# Patient Record
Sex: Female | Born: 1955 | Race: White | Hispanic: No | Marital: Married | Smoking: Never smoker
Health system: Southern US, Community
[De-identification: ages and names within clinical notes are randomized; demographics above are authoritative.]

## PROBLEM LIST (undated history)

## (undated) DIAGNOSIS — T7840XA Allergy, unspecified, initial encounter: Secondary | ICD-10-CM

## (undated) DIAGNOSIS — N186 End stage renal disease: Secondary | ICD-10-CM

## (undated) DIAGNOSIS — D649 Anemia, unspecified: Secondary | ICD-10-CM

## (undated) DIAGNOSIS — Z5189 Encounter for other specified aftercare: Secondary | ICD-10-CM

## (undated) DIAGNOSIS — E039 Hypothyroidism, unspecified: Secondary | ICD-10-CM

## (undated) DIAGNOSIS — Z8719 Personal history of other diseases of the digestive system: Secondary | ICD-10-CM

## (undated) DIAGNOSIS — Z9889 Other specified postprocedural states: Secondary | ICD-10-CM

## (undated) DIAGNOSIS — M8580 Other specified disorders of bone density and structure, unspecified site: Secondary | ICD-10-CM

## (undated) DIAGNOSIS — K641 Second degree hemorrhoids: Principal | ICD-10-CM

## (undated) DIAGNOSIS — I10 Essential (primary) hypertension: Secondary | ICD-10-CM

## (undated) DIAGNOSIS — E785 Hyperlipidemia, unspecified: Secondary | ICD-10-CM

## (undated) DIAGNOSIS — K219 Gastro-esophageal reflux disease without esophagitis: Secondary | ICD-10-CM

## (undated) HISTORY — DX: Anemia, unspecified: D64.9

## (undated) HISTORY — DX: Gastro-esophageal reflux disease without esophagitis: K21.9

## (undated) HISTORY — PX: COLONOSCOPY: SHX174

## (undated) HISTORY — DX: Allergy, unspecified, initial encounter: T78.40XA

## (undated) HISTORY — DX: Second degree hemorrhoids: K64.1

## (undated) HISTORY — PX: HEMORRHOID BANDING: SHX5850

## (undated) HISTORY — DX: Encounter for other specified aftercare: Z51.89

## (undated) HISTORY — PX: POLYPECTOMY: SHX149

## (undated) HISTORY — DX: Essential (primary) hypertension: I10

## (undated) HISTORY — DX: Personal history of other diseases of the digestive system: Z87.19

## (undated) HISTORY — DX: End stage renal disease: N18.6

## (undated) HISTORY — DX: Other specified disorders of bone density and structure, unspecified site: M85.80

## (undated) HISTORY — PX: UPPER GASTROINTESTINAL ENDOSCOPY: SHX188

## (undated) HISTORY — PX: OTHER SURGICAL HISTORY: SHX169

## (undated) HISTORY — DX: Hyperlipidemia, unspecified: E78.5

## (undated) HISTORY — DX: Other specified postprocedural states: Z98.890

## (undated) HISTORY — DX: Hypothyroidism, unspecified: E03.9

---

## 1981-06-12 HISTORY — PX: EXPLORATORY LAPAROTOMY: SUR591

## 1999-04-29 ENCOUNTER — Ambulatory Visit (HOSPITAL_COMMUNITY): Admission: RE | Admit: 1999-04-29 | Discharge: 1999-04-29 | Payer: Self-pay | Admitting: Gastroenterology

## 1999-04-29 ENCOUNTER — Encounter: Payer: Self-pay | Admitting: Gastroenterology

## 2003-01-28 ENCOUNTER — Other Ambulatory Visit: Admission: RE | Admit: 2003-01-28 | Discharge: 2003-01-28 | Payer: Self-pay | Admitting: Obstetrics & Gynecology

## 2004-02-16 ENCOUNTER — Other Ambulatory Visit: Admission: RE | Admit: 2004-02-16 | Discharge: 2004-02-16 | Payer: Self-pay | Admitting: Obstetrics & Gynecology

## 2004-02-22 ENCOUNTER — Encounter: Admission: RE | Admit: 2004-02-22 | Discharge: 2004-02-22 | Payer: Self-pay | Admitting: Obstetrics & Gynecology

## 2005-02-28 ENCOUNTER — Encounter: Admission: RE | Admit: 2005-02-28 | Discharge: 2005-02-28 | Payer: Self-pay | Admitting: Obstetrics & Gynecology

## 2005-04-10 ENCOUNTER — Other Ambulatory Visit: Admission: RE | Admit: 2005-04-10 | Discharge: 2005-04-10 | Payer: Self-pay | Admitting: Obstetrics & Gynecology

## 2005-05-02 ENCOUNTER — Encounter: Admission: RE | Admit: 2005-05-02 | Discharge: 2005-05-02 | Payer: Self-pay | Admitting: Endocrinology

## 2005-05-23 ENCOUNTER — Ambulatory Visit: Payer: Self-pay | Admitting: Hematology & Oncology

## 2005-06-14 ENCOUNTER — Ambulatory Visit: Payer: Self-pay | Admitting: Gastroenterology

## 2005-06-29 ENCOUNTER — Encounter (INDEPENDENT_AMBULATORY_CARE_PROVIDER_SITE_OTHER): Payer: Self-pay | Admitting: *Deleted

## 2005-06-29 ENCOUNTER — Ambulatory Visit (HOSPITAL_COMMUNITY): Admission: RE | Admit: 2005-06-29 | Discharge: 2005-06-29 | Payer: Self-pay | Admitting: Nephrology

## 2005-07-10 ENCOUNTER — Ambulatory Visit: Payer: Self-pay | Admitting: Hematology & Oncology

## 2005-08-01 ENCOUNTER — Ambulatory Visit: Payer: Self-pay | Admitting: Gastroenterology

## 2005-08-01 ENCOUNTER — Encounter (INDEPENDENT_AMBULATORY_CARE_PROVIDER_SITE_OTHER): Payer: Self-pay | Admitting: Specialist

## 2005-09-01 ENCOUNTER — Ambulatory Visit: Payer: Self-pay | Admitting: Hematology & Oncology

## 2005-10-26 ENCOUNTER — Ambulatory Visit: Payer: Self-pay | Admitting: Hematology & Oncology

## 2005-10-30 LAB — CBC & DIFF AND RETIC
Basophils Absolute: 0 10*3/uL (ref 0.0–0.1)
EOS%: 7.7 % — ABNORMAL HIGH (ref 0.0–7.0)
HCT: 31.9 % — ABNORMAL LOW (ref 34.8–46.6)
HGB: 10.9 g/dL — ABNORMAL LOW (ref 11.6–15.9)
LYMPH%: 21.4 % (ref 14.0–48.0)
MCH: 31.7 pg (ref 26.0–34.0)
MCV: 92.7 fL (ref 81.0–101.0)
MONO%: 5.9 % (ref 0.0–13.0)
NEUT%: 64.5 % (ref 39.6–76.8)
Platelets: 274 10*3/uL (ref 145–400)

## 2005-11-01 LAB — TRANSFERRIN RECEPTOR, SOLUABLE: Transferrin Receptor, Soluble: 3.3 mg/L (ref 1.9–4.4)

## 2005-12-11 ENCOUNTER — Ambulatory Visit: Payer: Self-pay | Admitting: Hematology & Oncology

## 2005-12-11 LAB — COMPREHENSIVE METABOLIC PANEL
AST: 16 U/L (ref 0–37)
Albumin: 4.4 g/dL (ref 3.5–5.2)
Alkaline Phosphatase: 103 U/L (ref 39–117)
BUN: 42 mg/dL — ABNORMAL HIGH (ref 6–23)
Creatinine, Ser: 2.93 mg/dL — ABNORMAL HIGH (ref 0.40–1.20)
Potassium: 4.4 mEq/L (ref 3.5–5.3)

## 2005-12-11 LAB — CBC WITH DIFFERENTIAL/PLATELET
BASO%: 0.5 % (ref 0.0–2.0)
EOS%: 6.5 % (ref 0.0–7.0)
Eosinophils Absolute: 0.5 10*3/uL (ref 0.0–0.5)
MCV: 93.8 fL (ref 81.0–101.0)
MONO%: 6.2 % (ref 0.0–13.0)
NEUT#: 5.7 10*3/uL (ref 1.5–6.5)
RBC: 4.29 10*6/uL (ref 3.70–5.32)
RDW: 15.4 % — ABNORMAL HIGH (ref 11.3–14.5)

## 2005-12-14 LAB — FERRITIN: Ferritin: 334 ng/mL — ABNORMAL HIGH (ref 10–291)

## 2006-03-09 ENCOUNTER — Ambulatory Visit: Payer: Self-pay | Admitting: Hematology & Oncology

## 2006-03-12 LAB — CBC & DIFF AND RETIC
BASO%: 0.4 % (ref 0.0–2.0)
LYMPH%: 20.5 % (ref 14.0–48.0)
MCH: 30.8 pg (ref 26.0–34.0)
MCHC: 33.6 g/dL (ref 32.0–36.0)
MCV: 91.8 fL (ref 81.0–101.0)
MONO%: 6.1 % (ref 0.0–13.0)
Platelets: 271 10*3/uL (ref 145–400)
RBC: 3.72 10*6/uL (ref 3.70–5.32)
Retic %: 3.5 % — ABNORMAL HIGH (ref 0.4–2.3)

## 2006-03-12 LAB — COMPREHENSIVE METABOLIC PANEL
CO2: 23 mEq/L (ref 19–32)
Glucose, Bld: 124 mg/dL — ABNORMAL HIGH (ref 70–99)
Sodium: 139 mEq/L (ref 135–145)
Total Bilirubin: 0.3 mg/dL (ref 0.3–1.2)
Total Protein: 7.3 g/dL (ref 6.0–8.3)

## 2006-03-12 LAB — URIC ACID: Uric Acid, Serum: 7.4 mg/dL — ABNORMAL HIGH (ref 2.4–7.0)

## 2006-03-12 LAB — PHOSPHORUS: Phosphorus: 3 mg/dL (ref 2.3–4.6)

## 2006-03-30 ENCOUNTER — Encounter: Admission: RE | Admit: 2006-03-30 | Discharge: 2006-03-30 | Payer: Self-pay | Admitting: Obstetrics & Gynecology

## 2006-04-17 ENCOUNTER — Encounter: Admission: RE | Admit: 2006-04-17 | Discharge: 2006-04-17 | Payer: Self-pay | Admitting: Obstetrics & Gynecology

## 2006-06-06 ENCOUNTER — Ambulatory Visit: Payer: Self-pay | Admitting: Cardiology

## 2006-06-13 ENCOUNTER — Ambulatory Visit: Payer: Self-pay | Admitting: Hematology & Oncology

## 2006-06-18 LAB — CBC & DIFF AND RETIC
BASO%: 0.3 % (ref 0.0–2.0)
Eosinophils Absolute: 0.6 10*3/uL — ABNORMAL HIGH (ref 0.0–0.5)
HCT: 37.8 % (ref 34.8–46.6)
HGB: 12.2 g/dL (ref 11.6–15.9)
IRF: 0.42 — ABNORMAL HIGH (ref 0.130–0.330)
LYMPH%: 21.4 % (ref 14.0–48.0)
MONO#: 0.5 10*3/uL (ref 0.1–0.9)
NEUT#: 6.1 10*3/uL (ref 1.5–6.5)
NEUT%: 66.2 % (ref 39.6–76.8)
Platelets: 326 10*3/uL (ref 145–400)
WBC: 9.2 10*3/uL (ref 3.9–10.0)
lymph#: 2 10*3/uL (ref 0.9–3.3)

## 2006-06-20 LAB — TRANSFERRIN RECEPTOR, SOLUABLE: Transferrin Receptor, Soluble: 34.9 nmol/L

## 2006-06-20 LAB — FERRITIN: Ferritin: 249 ng/mL (ref 10–291)

## 2006-09-19 ENCOUNTER — Ambulatory Visit: Payer: Self-pay | Admitting: Hematology & Oncology

## 2006-09-24 LAB — CBC WITH DIFFERENTIAL/PLATELET
Basophils Absolute: 0 10*3/uL (ref 0.0–0.1)
EOS%: 6.8 % (ref 0.0–7.0)
Eosinophils Absolute: 0.6 10*3/uL — ABNORMAL HIGH (ref 0.0–0.5)
HGB: 12.2 g/dL (ref 11.6–15.9)
MCH: 30.3 pg (ref 26.0–34.0)
MCV: 87.6 fL (ref 81.0–101.0)
MONO%: 6.8 % (ref 0.0–13.0)
NEUT#: 5.4 10*3/uL (ref 1.5–6.5)
RBC: 4.03 10*6/uL (ref 3.70–5.32)
RDW: 15.5 % — ABNORMAL HIGH (ref 11.3–14.5)
lymph#: 1.9 10*3/uL (ref 0.9–3.3)

## 2006-12-20 ENCOUNTER — Ambulatory Visit: Payer: Self-pay | Admitting: Hematology & Oncology

## 2007-01-21 LAB — CBC & DIFF AND RETIC
Basophils Absolute: 0 10*3/uL (ref 0.0–0.1)
EOS%: 7.5 % — ABNORMAL HIGH (ref 0.0–7.0)
HCT: 31.6 % — ABNORMAL LOW (ref 34.8–46.6)
HGB: 10.9 g/dL — ABNORMAL LOW (ref 11.6–15.9)
LYMPH%: 22.5 % (ref 14.0–48.0)
MCH: 31.2 pg (ref 26.0–34.0)
MCV: 90.4 fL (ref 81.0–101.0)
MONO%: 6.8 % (ref 0.0–13.0)
NEUT%: 62.8 % (ref 39.6–76.8)
Platelets: 245 10*3/uL (ref 145–400)
RDW: 16 % — ABNORMAL HIGH (ref 11.3–14.5)

## 2007-03-13 ENCOUNTER — Ambulatory Visit: Payer: Self-pay | Admitting: Hematology & Oncology

## 2007-03-18 LAB — CBC & DIFF AND RETIC
BASO%: 0.5 % (ref 0.0–2.0)
Basophils Absolute: 0.1 10*3/uL (ref 0.0–0.1)
HCT: 32.3 % — ABNORMAL LOW (ref 34.8–46.6)
HGB: 11.3 g/dL — ABNORMAL LOW (ref 11.6–15.9)
IRF: 0.36 — ABNORMAL HIGH (ref 0.130–0.330)
MONO#: 0.8 10*3/uL (ref 0.1–0.9)
NEUT%: 71.6 % (ref 39.6–76.8)
RETIC #: 63.4 10*3/uL (ref 19.7–115.1)
WBC: 11.4 10*3/uL — ABNORMAL HIGH (ref 3.9–10.0)
lymph#: 1.8 10*3/uL (ref 0.9–3.3)

## 2007-03-18 LAB — CHCC SMEAR

## 2007-03-19 ENCOUNTER — Encounter: Admission: RE | Admit: 2007-03-19 | Discharge: 2007-03-19 | Payer: Self-pay | Admitting: Hematology & Oncology

## 2007-04-09 ENCOUNTER — Ambulatory Visit: Payer: Self-pay | Admitting: Internal Medicine

## 2007-04-09 LAB — CONVERTED CEMR LAB
Basophils Absolute: 0.1 10*3/uL (ref 0.0–0.1)
Basophils Relative: 1 % (ref 0.0–1.0)
Eosinophils Absolute: 0.7 10*3/uL — ABNORMAL HIGH (ref 0.0–0.6)
Eosinophils Relative: 7.3 % — ABNORMAL HIGH (ref 0.0–5.0)
Monocytes Absolute: 0.7 10*3/uL (ref 0.2–0.7)
Neutrophils Relative %: 58.6 % (ref 43.0–77.0)
Pro B Natriuretic peptide (BNP): 152 pg/mL — ABNORMAL HIGH (ref 0.0–100.0)
RBC: 3.72 M/uL — ABNORMAL LOW (ref 3.87–5.11)
RDW: 15.1 % — ABNORMAL HIGH (ref 11.5–14.6)
Sed Rate: 22 mm/hr (ref 0–25)
WBC: 9.8 10*3/uL (ref 4.5–10.5)

## 2007-04-18 ENCOUNTER — Encounter: Admission: RE | Admit: 2007-04-18 | Discharge: 2007-04-18 | Payer: Self-pay | Admitting: Obstetrics & Gynecology

## 2007-04-23 ENCOUNTER — Ambulatory Visit: Payer: Self-pay | Admitting: Internal Medicine

## 2007-04-29 ENCOUNTER — Ambulatory Visit: Payer: Self-pay | Admitting: Hematology & Oncology

## 2007-06-27 ENCOUNTER — Ambulatory Visit: Payer: Self-pay | Admitting: Hematology & Oncology

## 2007-07-01 LAB — CBC & DIFF AND RETIC
Basophils Absolute: 0 10*3/uL (ref 0.0–0.1)
Eosinophils Absolute: 0.7 10*3/uL — ABNORMAL HIGH (ref 0.0–0.5)
HGB: 11.7 g/dL (ref 11.6–15.9)
LYMPH%: 19.7 % (ref 14.0–48.0)
MCV: 90.8 fL (ref 81.0–101.0)
MONO%: 5 % (ref 0.0–13.0)
NEUT#: 6 10*3/uL (ref 1.5–6.5)
Platelets: 287 10*3/uL (ref 145–400)
RETIC #: 46.8 10*3/uL (ref 19.7–115.1)

## 2007-09-24 ENCOUNTER — Ambulatory Visit: Payer: Self-pay | Admitting: Hematology & Oncology

## 2007-09-26 LAB — COMPREHENSIVE METABOLIC PANEL
AST: 14 U/L (ref 0–37)
Albumin: 4.6 g/dL (ref 3.5–5.2)
Alkaline Phosphatase: 126 U/L — ABNORMAL HIGH (ref 39–117)
Potassium: 4.4 mEq/L (ref 3.5–5.3)
Sodium: 142 mEq/L (ref 135–145)
Total Protein: 7.6 g/dL (ref 6.0–8.3)

## 2007-09-26 LAB — CBC WITH DIFFERENTIAL/PLATELET
EOS%: 5.3 % (ref 0.0–7.0)
Eosinophils Absolute: 0.6 10*3/uL — ABNORMAL HIGH (ref 0.0–0.5)
MCH: 30.8 pg (ref 26.0–34.0)
MCV: 90.2 fL (ref 81.0–101.0)
MONO%: 6.1 % (ref 0.0–13.0)
NEUT#: 7.3 10*3/uL — ABNORMAL HIGH (ref 1.5–6.5)
RBC: 3.89 10*6/uL (ref 3.70–5.32)
RDW: 14.5 % (ref 11.3–14.5)

## 2007-12-24 ENCOUNTER — Ambulatory Visit: Payer: Self-pay | Admitting: Hematology & Oncology

## 2007-12-31 ENCOUNTER — Ambulatory Visit: Payer: Self-pay | Admitting: Hematology & Oncology

## 2008-01-02 LAB — CHCC SATELLITE - SMEAR

## 2008-01-02 LAB — CBC WITH DIFFERENTIAL (CANCER CENTER ONLY)
BASO%: 0.8 % (ref 0.0–2.0)
EOS%: 4.4 % (ref 0.0–7.0)
HGB: 12.1 g/dL (ref 11.6–15.9)
LYMPH#: 3.1 10*3/uL (ref 0.9–3.3)
MCHC: 33.2 g/dL (ref 32.0–36.0)
MONO#: 0.8 10*3/uL (ref 0.1–0.9)
NEUT#: 7.3 10*3/uL — ABNORMAL HIGH (ref 1.5–6.5)
RDW: 13.1 % (ref 10.5–14.6)
WBC: 11.7 10*3/uL — ABNORMAL HIGH (ref 3.9–10.0)

## 2008-01-02 LAB — RETICULOCYTES (CHCC)
RBC.: 3.96 MIL/uL (ref 3.87–5.11)
Retic Ct Pct: 1.8 % (ref 0.4–3.1)

## 2008-01-04 LAB — IRON AND TIBC: Iron: 44 ug/dL (ref 42–145)

## 2008-01-04 LAB — FERRITIN: Ferritin: 572 ng/mL — ABNORMAL HIGH (ref 10–291)

## 2008-03-31 ENCOUNTER — Ambulatory Visit: Payer: Self-pay | Admitting: Hematology & Oncology

## 2008-04-01 LAB — CBC WITH DIFFERENTIAL (CANCER CENTER ONLY)
BASO%: 0.8 % (ref 0.0–2.0)
EOS%: 6.3 % (ref 0.0–7.0)
HCT: 34.6 % — ABNORMAL LOW (ref 34.8–46.6)
LYMPH#: 2.4 10*3/uL (ref 0.9–3.3)
LYMPH%: 27.6 % (ref 14.0–48.0)
MCHC: 33.5 g/dL (ref 32.0–36.0)
MONO#: 0.6 10*3/uL (ref 0.1–0.9)
NEUT%: 58.2 % (ref 39.6–80.0)
Platelets: 266 10*3/uL (ref 145–400)
RDW: 12.2 % (ref 10.5–14.6)
WBC: 8.6 10*3/uL (ref 3.9–10.0)

## 2008-04-01 LAB — COMPREHENSIVE METABOLIC PANEL
ALT: 12 U/L (ref 0–35)
AST: 12 U/L (ref 0–37)
Alkaline Phosphatase: 106 U/L (ref 39–117)
Sodium: 139 mEq/L (ref 135–145)
Total Bilirubin: 0.3 mg/dL (ref 0.3–1.2)
Total Protein: 7.6 g/dL (ref 6.0–8.3)

## 2008-04-01 LAB — CHCC SATELLITE - SMEAR

## 2008-04-01 LAB — RETICULOCYTES (CHCC)
ABS Retic: 51 10*3/uL (ref 19.0–186.0)
Retic Ct Pct: 1.3 % (ref 0.4–3.1)

## 2008-07-15 ENCOUNTER — Ambulatory Visit: Payer: Self-pay | Admitting: Hematology & Oncology

## 2008-07-16 LAB — CBC WITH DIFFERENTIAL (CANCER CENTER ONLY)
BASO%: 0.6 % (ref 0.0–2.0)
EOS%: 6.5 % (ref 0.0–7.0)
HCT: 35.8 % (ref 34.8–46.6)
LYMPH%: 27.4 % (ref 14.0–48.0)
MCH: 30 pg (ref 26.0–34.0)
MCHC: 33.2 g/dL (ref 32.0–36.0)
MCV: 90 fL (ref 81–101)
MONO#: 0.4 10*3/uL (ref 0.1–0.9)
MONO%: 4.7 % (ref 0.0–13.0)
NEUT%: 60.8 % (ref 39.6–80.0)
Platelets: 260 10*3/uL (ref 145–400)
RDW: 12.3 % (ref 10.5–14.6)
WBC: 8.3 10*3/uL (ref 3.9–10.0)

## 2008-07-28 ENCOUNTER — Encounter: Admission: RE | Admit: 2008-07-28 | Discharge: 2008-07-28 | Payer: Self-pay | Admitting: Obstetrics & Gynecology

## 2009-01-13 ENCOUNTER — Ambulatory Visit: Payer: Self-pay | Admitting: Hematology & Oncology

## 2009-01-14 LAB — CBC WITH DIFFERENTIAL (CANCER CENTER ONLY)
BASO#: 0.1 10*3/uL (ref 0.0–0.2)
Eosinophils Absolute: 0.6 10*3/uL — ABNORMAL HIGH (ref 0.0–0.5)
HCT: 35.7 % (ref 34.8–46.6)
LYMPH%: 24 % (ref 14.0–48.0)
MCH: 30.3 pg (ref 26.0–34.0)
MCV: 93 fL (ref 81–101)
MONO#: 0.5 10*3/uL (ref 0.1–0.9)
MONO%: 5.4 % (ref 0.0–13.0)
NEUT%: 62.5 % (ref 39.6–80.0)
Platelets: 252 10*3/uL (ref 145–400)
RBC: 3.85 10*6/uL (ref 3.70–5.32)
WBC: 8.5 10*3/uL (ref 3.9–10.0)

## 2009-01-14 LAB — FERRITIN: Ferritin: 640 ng/mL — ABNORMAL HIGH (ref 10–291)

## 2009-01-14 LAB — COMPREHENSIVE METABOLIC PANEL
ALT: 13 U/L (ref 0–35)
Alkaline Phosphatase: 108 U/L (ref 39–117)
CO2: 20 mEq/L (ref 19–32)
Creatinine, Ser: 2.83 mg/dL — ABNORMAL HIGH (ref 0.40–1.20)
Sodium: 140 mEq/L (ref 135–145)
Total Bilirubin: 0.3 mg/dL (ref 0.3–1.2)
Total Protein: 7.1 g/dL (ref 6.0–8.3)

## 2009-01-14 LAB — CHCC SATELLITE - SMEAR

## 2009-07-14 ENCOUNTER — Ambulatory Visit: Payer: Self-pay | Admitting: Hematology & Oncology

## 2009-07-15 LAB — CBC WITH DIFFERENTIAL (CANCER CENTER ONLY)
BASO#: 0 10e3/uL (ref 0.0–0.2)
BASO%: 0.5 % (ref 0.0–2.0)
EOS%: 8.3 % — ABNORMAL HIGH (ref 0.0–7.0)
Eosinophils Absolute: 0.7 10e3/uL — ABNORMAL HIGH (ref 0.0–0.5)
HCT: 36.8 % (ref 34.8–46.6)
HGB: 12 g/dL (ref 11.6–15.9)
LYMPH#: 2.1 10e3/uL (ref 0.9–3.3)
LYMPH%: 26.1 % (ref 14.0–48.0)
MCH: 29.7 pg (ref 26.0–34.0)
MCHC: 32.7 g/dL (ref 32.0–36.0)
MCV: 91 fL (ref 81–101)
MONO#: 0.5 10e3/uL (ref 0.1–0.9)
MONO%: 6.5 % (ref 0.0–13.0)
NEUT#: 4.6 10e3/uL (ref 1.5–6.5)
NEUT%: 58.6 % (ref 39.6–80.0)
Platelets: 285 10e3/uL (ref 145–400)
RBC: 4.05 10e6/uL (ref 3.70–5.32)
RDW: 12.5 % (ref 10.5–14.6)
WBC: 7.9 10e3/uL (ref 3.9–10.0)

## 2009-07-15 LAB — RETICULOCYTES (CHCC)
ABS Retic: 51.4 10*3/uL (ref 19.0–186.0)
RBC.: 3.95 MIL/uL (ref 3.87–5.11)
Retic Ct Pct: 1.3 % (ref 0.4–3.1)

## 2009-07-15 LAB — FERRITIN: Ferritin: 672 ng/mL — ABNORMAL HIGH (ref 10–291)

## 2009-07-15 LAB — CHCC SATELLITE - SMEAR

## 2009-08-03 ENCOUNTER — Encounter: Admission: RE | Admit: 2009-08-03 | Discharge: 2009-08-03 | Payer: Self-pay | Admitting: Obstetrics & Gynecology

## 2009-08-10 ENCOUNTER — Encounter: Admission: RE | Admit: 2009-08-10 | Discharge: 2009-08-10 | Payer: Self-pay | Admitting: Obstetrics & Gynecology

## 2010-01-11 ENCOUNTER — Ambulatory Visit: Payer: Self-pay | Admitting: Hematology & Oncology

## 2010-01-12 LAB — CMP (CANCER CENTER ONLY)
AST: 17 U/L (ref 11–38)
Albumin: 4.1 g/dL (ref 3.3–5.5)
Alkaline Phosphatase: 109 U/L — ABNORMAL HIGH (ref 26–84)
Calcium: 10.2 mg/dL (ref 8.0–10.3)
Chloride: 104 mEq/L (ref 98–108)
Total Protein: 7.4 g/dL (ref 6.4–8.1)

## 2010-01-12 LAB — CBC WITH DIFFERENTIAL (CANCER CENTER ONLY)
BASO#: 0 10*3/uL (ref 0.0–0.2)
BASO%: 0.5 % (ref 0.0–2.0)
EOS%: 8.1 % — ABNORMAL HIGH (ref 0.0–7.0)
Eosinophils Absolute: 0.7 10*3/uL — ABNORMAL HIGH (ref 0.0–0.5)
HCT: 36.1 % (ref 34.8–46.6)
LYMPH#: 2.2 10*3/uL (ref 0.9–3.3)
LYMPH%: 25.9 % (ref 14.0–48.0)
MONO#: 0.6 10*3/uL (ref 0.1–0.9)
MONO%: 6.6 % (ref 0.0–13.0)
NEUT#: 5 10*3/uL (ref 1.5–6.5)
Platelets: 251 10*3/uL (ref 145–400)
RDW: 12.3 % (ref 10.5–14.6)

## 2010-03-23 ENCOUNTER — Encounter: Admission: RE | Admit: 2010-03-23 | Discharge: 2010-03-23 | Payer: Self-pay | Admitting: Obstetrics & Gynecology

## 2010-07-03 ENCOUNTER — Encounter: Payer: Self-pay | Admitting: Obstetrics & Gynecology

## 2010-07-20 ENCOUNTER — Encounter (HOSPITAL_BASED_OUTPATIENT_CLINIC_OR_DEPARTMENT_OTHER): Payer: Managed Care, Other (non HMO) | Admitting: Hematology & Oncology

## 2010-07-20 ENCOUNTER — Other Ambulatory Visit: Payer: Self-pay | Admitting: Hematology & Oncology

## 2010-07-20 DIAGNOSIS — N186 End stage renal disease: Secondary | ICD-10-CM

## 2010-07-20 DIAGNOSIS — N189 Chronic kidney disease, unspecified: Secondary | ICD-10-CM

## 2010-07-20 DIAGNOSIS — D509 Iron deficiency anemia, unspecified: Secondary | ICD-10-CM

## 2010-07-20 DIAGNOSIS — D649 Anemia, unspecified: Secondary | ICD-10-CM

## 2010-07-20 DIAGNOSIS — D631 Anemia in chronic kidney disease: Secondary | ICD-10-CM

## 2010-07-20 LAB — COMPREHENSIVE METABOLIC PANEL
ALT: 17 U/L (ref 0–35)
AST: 12 U/L (ref 0–37)
Alkaline Phosphatase: 101 U/L (ref 39–117)
BUN: 42 mg/dL — ABNORMAL HIGH (ref 6–23)
Calcium: 9.5 mg/dL (ref 8.4–10.5)
Chloride: 109 mEq/L (ref 96–112)
Sodium: 140 mEq/L (ref 135–145)
Total Bilirubin: 0.3 mg/dL (ref 0.3–1.2)
Total Protein: 6.6 g/dL (ref 6.0–8.3)

## 2010-07-20 LAB — CBC WITH DIFFERENTIAL (CANCER CENTER ONLY)
EOS%: 4.7 % (ref 0.0–7.0)
HCT: 38.4 % (ref 34.8–46.6)
HGB: 12.5 g/dL (ref 11.6–15.9)
LYMPH#: 2.2 10*3/uL (ref 0.9–3.3)
LYMPH%: 21.6 % (ref 14.0–48.0)
MONO#: 0.7 10*3/uL (ref 0.1–0.9)
MONO%: 7 % (ref 0.0–13.0)
NEUT#: 6.8 10*3/uL — ABNORMAL HIGH (ref 1.5–6.5)
NEUT%: 66 % (ref 39.6–80.0)
Platelets: 264 10*3/uL (ref 145–400)

## 2010-08-24 LAB — HM HEPATITIS C SCREENING LAB: HM Hepatitis Screen: NEGATIVE

## 2010-10-25 NOTE — Assessment & Plan Note (Signed)
[facility]                             PULMONARY OFFICE NOTE   NAME:Sandy Moore, Sandy Moore                   MRN:          [id number]  DATE:04/23/2007                            DOB:          Jan 19, 1956    HISTORY:  A 55 year old white female, never smoker,  who feels great,  in for followup evaluation of cough with abnormal CT scan, and reports  that she is feeling great.  Previously, she reported coughing so hard  she was vomiting.  This is resolved on Reglan dosed before meals and at  bedtime in addition to Prilosec.  She was given a short course of  prednisone which she finished about a week ago, and denies any relapse  in her symptoms since that time.  She denies any fevers, chills, sweats,  orthopnea, PND or leg swelling, and certainly has minimal cough with no  more vomiting.   PHYSICAL EXAMINATION:  She is a pleasant, ambulatory, obese white female  in no acute distress.  She is afebrile with stable vital signs.  HEENT:  Unremarkable.  Oropharynx clear.  Lung fields perfectly bilaterally to auscultation and percussion.  HEART:  Regular rhythm without murmurs, gallops, or rubs.  ABDOMEN:  Soft, benign.  EXTREMITIES:  Warm without calf tenderness, cyanosis, clubbing, or  edema.   IMPRESSION:  Cough has totally resolved with empiric treatment directed  at reflux.  A unifying diagnosis might be aspiration mechanism causing  her pulmonary infiltrates, and, therefore, I recommended continued  aggressive treatment directed at reflux with a combination of Reglan and  proton pump inhibitor therapy along with diet.  If she relapses on this  treatment and off of prednisone, I might consider one of the  inflammatory interstitial lung diseases by bronchiolitis obliterans with  organizing pneumonia.  However, note her baseline sed rate was only 22,  so I think this is unlikely, and aspiration would seem to be the  leading suspect.   This brings up the  issue of how long to treat her with Reglan and proton  pump inhibitor therapy.  For now, I have recommended that she continue  this through the holidays, and if she relapses off of Reglan, then we  will know that further gastrointestinal evaluation is needed, and would  refer her to the [place] [facility] since Dr. Victorino Dike  is now retired.   Followup chest x-ray is optional in the absence of any symptoms,  especially given the normal sed rate as outlined above, and her  convincing response to the above regimen.     Charlaine Dalton. Sherene Sires, MD, [facility]  Electronically Signed    MBW/MedQ  DD: 04/23/2007  DT: 04/24/2007  Job #: [id number]   cc:   Brooke Bonito, M.D.  Rose Phi. Myna Hidalgo, M.D.  Garnetta Buddy, M.D.

## 2010-10-25 NOTE — Assessment & Plan Note (Signed)
[facility]                             PULMONARY OFFICE NOTE   Sandy Moore, Sandy Moore                   MRN:          [id number]  DATE:04/09/2007                            DOB:          01/25/1956    REFERRING PHYSICIAN:  Rose Phi. Myna Hidalgo, M.D.   REASON FOR CONSULTATION:  Cough.   HISTORY:  This is a 55 year old white female with chronic renal  insufficiency and anemia, under the care of Dr. Myna Hidalgo for anemia  treatment, and noted to have a cough that began while she was caught in  cold rain in August 2008 in [place].  She began having severe coughing  paroxysms associated with vomiting (she says the vomiting and the  coughing actually started about the same day) and eventually was  coughing up green mucus before she took a course of Biaxin.  Since then,  she has been bothered by dry cough associated with persistent vomiting  after coughing and also urinary incontinence.  It is worse when she lies  down and early in the morning.  She denies any fevers, chills, sweats,  pleuritic pain, and presently has no excess sputum production or  significant dyspnea.   PAST MEDICAL HISTORY:  1. Hypertension, for which is not on ACE inhibitors.  2. Allergies and sinus problems.  3. Chronic renal failure.  4. She is also status post remote hysterectomy.  5. GERD, with a hiatal hernia documented on August 01, 2005.   ALLERGIES:  1. DIOVAN was felt to cause renal failure.  2. ERYTHROMYCIN caused anaphylaxis.   MEDICATIONS:  Taken in detail on the worksheet, dated April 09, 2007,  noting that she is already on Prilosec in high doses, dosed twice daily.   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   FAMILY HISTORY:  Positive for allergies in her mother, and lung cancer  in her father.   REVIEW OF SYSTEMS:  Taken in detail on the worksheet, and negative  except as outlined above.   PHYSICAL EXAMINATION:  GENERAL: This  is an anxious, pleasant obese white  female, in no acute distress, with a harsh barking upper airway quality  cough.  VITAL SIGNS:  She is afebrile, with normal vital signs.  HEENT: Remarkably unremarkable. Oropharynx is clear.  Nasal turbinates  reveal minimal nonspecific edema, with mucoid nasal discharge.  Ear  canals clear bilaterally.  NECK: Without cervical adenopathy or tenderness.  Trachea is midline.  LUNG FIELDS: Completely clear bilaterally to auscultation and  percussion.  I could not appreciate any crackles on inspiration or  expiration.  She did feel the urge to cough mostly at the beginning of  expiration or the end of inspiration.  HEART: Regular rate and rhythm, without murmur, gallop, or rub.  ABDOMEN: Soft, obese.  EXTREMITIES: Warm, without calf tenderness, cyanosis, clubbing, or  edema.   Most recent lab data was performed on March 27, 2007 and indicated a  BUN of 36, with a creatinine of 3.18, translating into a GFR of 15.   IMPRESSION:  Severe coughing paroxysms resulting in vomiting, with  pulmonary infiltrates that may be nothing more than aspiration injury,  because the cough seems markedly disproportionate to the severity of the  infiltrates.  I did recommend a CBC looking for eosinophils today, and  also a sed rate looking for any evidence of BOOP, as suggested by the  radiologist.   However, I spent most of my time talking to this patient today about  cyclical cough and the importance of eliminating the cough that is so  hard she vomits, to reduce the likelihood of further aspiration, and  also the avoidance of narcotics which actually may put her at risk for  aspiration injury as well.   My specific recommendations today were as follows:  1. Mucinex DM 1200 mg twice daily.  2. Addition of tramadol 50 mg 1 q.4 h. p.r.n. excessive coughing.  3. Add Reglan to the Prilosec, and reviewed also GERD diet with her in      detail in writing.  4. Prednisone  10 mg tablets, #14, to be tapered off over 6 days,      pending the results of her sed rate.  5. Follow up here in 2 weeks with a chest x-ray, to see to what extent      her cough and/or infiltrates have improved.     Charlaine Dalton. Sherene Sires, MD, [facility]  Electronically Signed    MBW/MedQ  DD: 04/09/2007  DT: 04/10/2007  Job #: [id number]   cc:   Rose Phi. Myna Hidalgo, M.D.  Cecille Aver, M.D.

## 2010-10-28 NOTE — Assessment & Plan Note (Signed)
[facility]                            CARDIOLOGY OFFICE NOTE   Sandy Moore, Sandy Moore                   MRN:          [id number]  DATE:06/13/2006                            DOB:          April 21, 1956    I received the formal results from [name]'s stress echo.  Though  she did have persistent ST segment depression, her wall motion was  completely normal with an ejection fraction of 60% both at rest and at  stress.   I clear her for renal transplant.  We will convey this to the patient  and also fax this information to [facility].     Sheridan C. Daleen Squibb, MD, [facility]  Electronically Signed    TCW/MedQ  DD: 06/13/2006  DT: 06/13/2006  Job #: [id number]   cc:   [facility] [phone] FAX TO ALICE Johnsie Kindred, M.D.  Garnetta Buddy, M.D.

## 2011-01-18 ENCOUNTER — Other Ambulatory Visit: Payer: Self-pay | Admitting: Hematology & Oncology

## 2011-01-18 ENCOUNTER — Encounter (HOSPITAL_BASED_OUTPATIENT_CLINIC_OR_DEPARTMENT_OTHER): Payer: Managed Care, Other (non HMO) | Admitting: Hematology & Oncology

## 2011-01-18 DIAGNOSIS — N189 Chronic kidney disease, unspecified: Secondary | ICD-10-CM

## 2011-01-18 DIAGNOSIS — D631 Anemia in chronic kidney disease: Secondary | ICD-10-CM

## 2011-01-18 DIAGNOSIS — N039 Chronic nephritic syndrome with unspecified morphologic changes: Secondary | ICD-10-CM

## 2011-01-18 DIAGNOSIS — N186 End stage renal disease: Secondary | ICD-10-CM

## 2011-01-18 LAB — CBC WITH DIFFERENTIAL (CANCER CENTER ONLY)
BASO%: 0.4 % (ref 0.0–2.0)
EOS%: 8.3 % — ABNORMAL HIGH (ref 0.0–7.0)
LYMPH#: 2.2 10*3/uL (ref 0.9–3.3)
MCH: 30.3 pg (ref 26.0–34.0)
MCHC: 33 g/dL (ref 32.0–36.0)
MONO%: 6 % (ref 0.0–13.0)
RBC: 3.86 10*6/uL (ref 3.70–5.32)
WBC: 9.9 10*3/uL (ref 3.9–10.0)

## 2011-01-18 LAB — COMPREHENSIVE METABOLIC PANEL
Albumin: 4.2 g/dL (ref 3.5–5.2)
BUN: 31 mg/dL — ABNORMAL HIGH (ref 6–23)
CO2: 21 mEq/L (ref 19–32)
Glucose, Bld: 116 mg/dL — ABNORMAL HIGH (ref 70–99)
Potassium: 4.7 mEq/L (ref 3.5–5.3)
Sodium: 139 mEq/L (ref 135–145)
Total Bilirubin: 0.3 mg/dL (ref 0.3–1.2)
Total Protein: 7 g/dL (ref 6.0–8.3)

## 2011-01-18 LAB — IRON AND TIBC
%SAT: 23 % (ref 20–55)
Iron: 59 ug/dL (ref 42–145)
TIBC: 260 ug/dL (ref 250–470)
UIBC: 201 ug/dL

## 2011-06-13 HISTORY — PX: KIDNEY TRANSPLANT: SHX239

## 2011-06-20 ENCOUNTER — Other Ambulatory Visit: Payer: Self-pay | Admitting: Obstetrics & Gynecology

## 2011-06-20 DIAGNOSIS — Z1231 Encounter for screening mammogram for malignant neoplasm of breast: Secondary | ICD-10-CM

## 2011-07-03 ENCOUNTER — Ambulatory Visit
Admission: RE | Admit: 2011-07-03 | Discharge: 2011-07-03 | Disposition: A | Discharge status: Home / Self Care | Payer: Managed Care, Other (non HMO) | Source: Ambulatory Visit | Attending: Obstetrics & Gynecology | Admitting: Obstetrics & Gynecology

## 2011-07-03 DIAGNOSIS — Z1231 Encounter for screening mammogram for malignant neoplasm of breast: Secondary | ICD-10-CM

## 2011-07-17 ENCOUNTER — Other Ambulatory Visit (HOSPITAL_BASED_OUTPATIENT_CLINIC_OR_DEPARTMENT_OTHER): Payer: Managed Care, Other (non HMO) | Admitting: Lab

## 2011-07-17 ENCOUNTER — Ambulatory Visit (HOSPITAL_BASED_OUTPATIENT_CLINIC_OR_DEPARTMENT_OTHER): Payer: Managed Care, Other (non HMO) | Admitting: Hematology & Oncology

## 2011-07-17 VITALS — BP 135/78 | HR 75 | Temp 97.4°F | Ht 64.0 in | Wt 224.0 lb

## 2011-07-17 DIAGNOSIS — N186 End stage renal disease: Secondary | ICD-10-CM

## 2011-07-17 DIAGNOSIS — N189 Chronic kidney disease, unspecified: Secondary | ICD-10-CM

## 2011-07-17 DIAGNOSIS — D631 Anemia in chronic kidney disease: Secondary | ICD-10-CM

## 2011-07-17 DIAGNOSIS — L739 Follicular disorder, unspecified: Secondary | ICD-10-CM

## 2011-07-17 LAB — CBC WITH DIFFERENTIAL (CANCER CENTER ONLY)
BASO#: 0 10*3/uL (ref 0.0–0.2)
Eosinophils Absolute: 0.8 10*3/uL — ABNORMAL HIGH (ref 0.0–0.5)
HCT: 35.3 % (ref 34.8–46.6)
HGB: 11.5 g/dL — ABNORMAL LOW (ref 11.6–15.9)
LYMPH#: 2.9 10*3/uL (ref 0.9–3.3)
MONO#: 0.9 10*3/uL (ref 0.1–0.9)
NEUT#: 7.4 10*3/uL — ABNORMAL HIGH (ref 1.5–6.5)
RBC: 3.84 10*6/uL (ref 3.70–5.32)

## 2011-07-17 MED ORDER — DOXYCYCLINE HYCLATE 100 MG PO TABS: 100.0000 mg | ORAL_TABLET | Freq: Two times a day (BID) | ORAL | Status: AC

## 2011-07-17 NOTE — Progress Notes (Signed)
This office note has been dictated.

## 2011-07-18 LAB — COMPREHENSIVE METABOLIC PANEL
AST: 11 U/L (ref 0–37)
BUN: 35 mg/dL — ABNORMAL HIGH (ref 6–23)
CO2: 19 mEq/L (ref 19–32)
Calcium: 9.5 mg/dL (ref 8.4–10.5)
Chloride: 108 mEq/L (ref 96–112)
Creatinine, Ser: 3.18 mg/dL — ABNORMAL HIGH (ref 0.50–1.10)
Total Bilirubin: 0.3 mg/dL (ref 0.3–1.2)

## 2011-07-18 LAB — VITAMIN D 25 HYDROXY (VIT D DEFICIENCY, FRACTURES): Vit D, 25-Hydroxy: 35 ng/mL (ref 30–89)

## 2011-07-18 LAB — IRON AND TIBC
Iron: 34 ug/dL — ABNORMAL LOW (ref 42–145)
TIBC: 291 ug/dL (ref 250–470)

## 2011-07-18 LAB — LACTATE DEHYDROGENASE: LDH: 131 U/L (ref 94–250)

## 2011-07-18 NOTE — Progress Notes (Signed)
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  CURRENT THERAPY: 1. Procrit 20,000 units subcu monthly for hemoglobin less than 11. 2. IV iron as indicated for iron deficiency.  INTERIM HISTORY:  Sandy Moore comes in for followup.  She is here basically every 6 months.  She is still awaiting a kidney transplant. She has had the opportunity to take some donated kidneys, but they have not been appropriate for her from what she tells me.  She still has the skin problems. This has not been as bad for her.  I did go ahead and give her some doxycycline to take p.o.  We do not have to worry about doxycycline with respect to her kidney disease.  When we last saw her, her ferritin was 484.  Iron saturation was 23%.  She is having no problem pain-wise. There has been no bleeding.  She has had no fever.  PHYSICAL EXAMINATION:  This is a well-developed well-nourished white female in no obvious distress.  Vital signs:  97.4, pulse 75, respiratory 16, blood pressure 135/78.  Weight is 224.  Head and Neck: Normocephalic, atraumatic skull.  There are no ocular or oral lesions. There are no palpable cervical or supraclavicular lymph nodes.  Lungs: Clear to percussion and auscultation bilaterally.  Cardiac:  Regular rate and rhythm with normal S1, S2.  There are no murmurs, rubs or bruits.  Abdomen:  Soft with good bowel sounds.  There is no palpable abdominal mass.  There is no fluid wave.  There is no palpable hepatosplenomegaly.  Back: No tenderness over the spine, ribs, or hips. Extremities:  No clubbing, cyanosis or edema.  Skin:  Exam does show these macular type lesions, mostly on her legs.  They have central eschars.  LABORATORY DATA:  White cell count 12, hemoglobin 11.5, hematocrit 35.3, platelet count 266.  IMPRESSION:  Sandy Moore is a 56 year old white female with end-stage renal disease.  She is not on dialysis as of yet.  She is still awaiting a kidney transplant.   She is still very functional.  I must say her blood count is holding quite steady and stable.  I am very impressed with this.  Will go ahead and plan to get her back in 6 more months for followup.  I do not see that we have to do any blood work in between visits.  Hopefully, when we see her back, she will have an new kidney.    ______________________________ Josph Macho, M.D. PRE/MEDQ  D:  07/17/2011  T:  07/18/2011  Job:  [id number]

## 2011-07-31 ENCOUNTER — Other Ambulatory Visit: Payer: Self-pay | Admitting: *Deleted

## 2011-07-31 ENCOUNTER — Other Ambulatory Visit: Payer: Self-pay | Admitting: Hematology & Oncology

## 2011-07-31 DIAGNOSIS — D631 Anemia in chronic kidney disease: Secondary | ICD-10-CM

## 2011-07-31 DIAGNOSIS — N189 Chronic kidney disease, unspecified: Secondary | ICD-10-CM

## 2011-07-31 NOTE — Progress Notes (Signed)
Per Dr. Myna Hidalgo, pt called and told her Iron was low and she could use a dose of FeraHeme.  Pt will schedule this appt.

## 2011-08-01 ENCOUNTER — Telehealth: Payer: Self-pay | Admitting: Hematology & Oncology

## 2011-08-01 NOTE — Telephone Encounter (Signed)
Pt aware of 2-22 Ari aware to pre cert

## 2011-08-01 NOTE — Telephone Encounter (Signed)
Left message with husband to call and schedule appointment

## 2011-08-04 ENCOUNTER — Ambulatory Visit (HOSPITAL_BASED_OUTPATIENT_CLINIC_OR_DEPARTMENT_OTHER): Payer: Managed Care, Other (non HMO)

## 2011-08-04 DIAGNOSIS — D509 Iron deficiency anemia, unspecified: Secondary | ICD-10-CM

## 2011-08-04 DIAGNOSIS — D631 Anemia in chronic kidney disease: Secondary | ICD-10-CM

## 2011-08-04 MED ORDER — FERUMOXYTOL INJECTION 510 MG/17 ML
510.0000 mg | Freq: Once | INTRAVENOUS | Status: AC
  Administered 2011-08-04: 510 mg via INTRAVENOUS
  Filled 2011-08-04: qty 17

## 2011-08-09 ENCOUNTER — Telehealth: Payer: Self-pay | Admitting: *Deleted

## 2011-08-09 NOTE — Telephone Encounter (Signed)
Message copied by Anselm Jungling on Wed Aug 09, 2011  2:31 PM ------      Message from: Arlan Organ R      Created: Thu Jul 20, 2011  6:44 PM       Labs are ok.  Renal function is low but this is expected.  Iron is a little low.  pete

## 2011-08-09 NOTE — Telephone Encounter (Signed)
Called patients personal cell phone machine to let her know that her labs were ok.  Her kidney function was a little low but this is expected. Her iron levels were just a little low.  Reinforced with patient to call us if she has any questions

## 2011-08-11 ENCOUNTER — Other Ambulatory Visit: Payer: Self-pay | Admitting: *Deleted

## 2011-12-22 DIAGNOSIS — D696 Thrombocytopenia, unspecified: Secondary | ICD-10-CM | POA: Diagnosis not present

## 2011-12-22 DIAGNOSIS — E039 Hypothyroidism, unspecified: Secondary | ICD-10-CM | POA: Diagnosis present

## 2011-12-22 DIAGNOSIS — D72819 Decreased white blood cell count, unspecified: Secondary | ICD-10-CM | POA: Diagnosis not present

## 2011-12-22 DIAGNOSIS — K219 Gastro-esophageal reflux disease without esophagitis: Secondary | ICD-10-CM | POA: Diagnosis present

## 2011-12-22 DIAGNOSIS — Z5181 Encounter for therapeutic drug level monitoring: Secondary | ICD-10-CM | POA: Diagnosis not present

## 2011-12-22 DIAGNOSIS — N186 End stage renal disease: Secondary | ICD-10-CM | POA: Diagnosis not present

## 2011-12-22 DIAGNOSIS — J8410 Pulmonary fibrosis, unspecified: Secondary | ICD-10-CM | POA: Diagnosis not present

## 2011-12-22 DIAGNOSIS — Z6841 Body Mass Index (BMI) 40.0 and over, adult: Secondary | ICD-10-CM | POA: Diagnosis not present

## 2011-12-22 DIAGNOSIS — I517 Cardiomegaly: Secondary | ICD-10-CM | POA: Diagnosis not present

## 2011-12-22 DIAGNOSIS — I120 Hypertensive chronic kidney disease with stage 5 chronic kidney disease or end stage renal disease: Secondary | ICD-10-CM | POA: Diagnosis present

## 2011-12-22 DIAGNOSIS — Z79899 Other long term (current) drug therapy: Secondary | ICD-10-CM | POA: Diagnosis not present

## 2011-12-22 DIAGNOSIS — Z48298 Encounter for aftercare following other organ transplant: Secondary | ICD-10-CM | POA: Diagnosis not present

## 2011-12-22 DIAGNOSIS — N269 Renal sclerosis, unspecified: Secondary | ICD-10-CM | POA: Diagnosis present

## 2011-12-22 DIAGNOSIS — E872 Acidosis, unspecified: Secondary | ICD-10-CM | POA: Diagnosis present

## 2011-12-22 DIAGNOSIS — Z01818 Encounter for other preprocedural examination: Secondary | ICD-10-CM | POA: Diagnosis not present

## 2011-12-22 DIAGNOSIS — E785 Hyperlipidemia, unspecified: Secondary | ICD-10-CM | POA: Diagnosis present

## 2011-12-22 DIAGNOSIS — T8610 Unspecified complication of kidney transplant: Secondary | ICD-10-CM | POA: Diagnosis not present

## 2011-12-22 DIAGNOSIS — Z5189 Encounter for other specified aftercare: Secondary | ICD-10-CM | POA: Diagnosis not present

## 2011-12-22 DIAGNOSIS — Z882 Allergy status to sulfonamides status: Secondary | ICD-10-CM | POA: Diagnosis not present

## 2011-12-22 DIAGNOSIS — D649 Anemia, unspecified: Secondary | ICD-10-CM | POA: Diagnosis not present

## 2011-12-22 DIAGNOSIS — D72829 Elevated white blood cell count, unspecified: Secondary | ICD-10-CM | POA: Diagnosis not present

## 2011-12-22 DIAGNOSIS — Z883 Allergy status to other anti-infective agents status: Secondary | ICD-10-CM | POA: Diagnosis not present

## 2011-12-22 DIAGNOSIS — N185 Chronic kidney disease, stage 5: Secondary | ICD-10-CM | POA: Diagnosis not present

## 2011-12-22 DIAGNOSIS — Z940 Kidney transplant status: Secondary | ICD-10-CM | POA: Diagnosis not present

## 2011-12-22 DIAGNOSIS — K5900 Constipation, unspecified: Secondary | ICD-10-CM | POA: Diagnosis not present

## 2011-12-23 DIAGNOSIS — N186 End stage renal disease: Secondary | ICD-10-CM | POA: Diagnosis not present

## 2012-01-01 DIAGNOSIS — Z79899 Other long term (current) drug therapy: Secondary | ICD-10-CM | POA: Diagnosis not present

## 2012-01-01 DIAGNOSIS — E039 Hypothyroidism, unspecified: Secondary | ICD-10-CM | POA: Diagnosis not present

## 2012-01-01 DIAGNOSIS — I10 Essential (primary) hypertension: Secondary | ICD-10-CM | POA: Diagnosis not present

## 2012-01-01 DIAGNOSIS — Z940 Kidney transplant status: Secondary | ICD-10-CM | POA: Diagnosis not present

## 2012-01-01 DIAGNOSIS — K5900 Constipation, unspecified: Secondary | ICD-10-CM | POA: Diagnosis not present

## 2012-01-02 DIAGNOSIS — Z09 Encounter for follow-up examination after completed treatment for conditions other than malignant neoplasm: Secondary | ICD-10-CM | POA: Diagnosis not present

## 2012-01-02 DIAGNOSIS — Z940 Kidney transplant status: Secondary | ICD-10-CM | POA: Diagnosis not present

## 2012-01-02 DIAGNOSIS — Z79899 Other long term (current) drug therapy: Secondary | ICD-10-CM | POA: Diagnosis not present

## 2012-01-04 DIAGNOSIS — Z940 Kidney transplant status: Secondary | ICD-10-CM | POA: Diagnosis not present

## 2012-01-08 DIAGNOSIS — Z79899 Other long term (current) drug therapy: Secondary | ICD-10-CM | POA: Diagnosis not present

## 2012-01-08 DIAGNOSIS — Z940 Kidney transplant status: Secondary | ICD-10-CM | POA: Diagnosis not present

## 2012-01-08 DIAGNOSIS — Z09 Encounter for follow-up examination after completed treatment for conditions other than malignant neoplasm: Secondary | ICD-10-CM | POA: Diagnosis not present

## 2012-01-09 ENCOUNTER — Telehealth: Payer: Self-pay | Admitting: Hematology & Oncology

## 2012-01-09 NOTE — Telephone Encounter (Signed)
Patient called and cx 01/16/12 appt and stated she will call back to resch.

## 2012-01-10 DIAGNOSIS — Z79899 Other long term (current) drug therapy: Secondary | ICD-10-CM | POA: Diagnosis not present

## 2012-01-10 DIAGNOSIS — T8610 Unspecified complication of kidney transplant: Secondary | ICD-10-CM | POA: Diagnosis not present

## 2012-01-11 DIAGNOSIS — Z79899 Other long term (current) drug therapy: Secondary | ICD-10-CM | POA: Diagnosis not present

## 2012-01-12 DIAGNOSIS — Z79899 Other long term (current) drug therapy: Secondary | ICD-10-CM | POA: Diagnosis not present

## 2012-01-15 ENCOUNTER — Ambulatory Visit: Payer: Managed Care, Other (non HMO) | Admitting: Hematology & Oncology

## 2012-01-15 ENCOUNTER — Other Ambulatory Visit: Payer: Managed Care, Other (non HMO) | Admitting: Lab

## 2012-01-16 ENCOUNTER — Other Ambulatory Visit: Payer: Managed Care, Other (non HMO) | Admitting: Lab

## 2012-01-16 ENCOUNTER — Ambulatory Visit: Payer: Managed Care, Other (non HMO) | Admitting: Hematology & Oncology

## 2012-01-17 DIAGNOSIS — Z79899 Other long term (current) drug therapy: Secondary | ICD-10-CM | POA: Diagnosis not present

## 2012-01-19 DIAGNOSIS — Z79899 Other long term (current) drug therapy: Secondary | ICD-10-CM | POA: Diagnosis not present

## 2012-01-23 DIAGNOSIS — T8610 Unspecified complication of kidney transplant: Secondary | ICD-10-CM | POA: Diagnosis not present

## 2012-01-23 DIAGNOSIS — Z79899 Other long term (current) drug therapy: Secondary | ICD-10-CM | POA: Diagnosis not present

## 2012-01-31 DIAGNOSIS — E039 Hypothyroidism, unspecified: Secondary | ICD-10-CM | POA: Diagnosis not present

## 2012-01-31 DIAGNOSIS — Z79899 Other long term (current) drug therapy: Secondary | ICD-10-CM | POA: Diagnosis not present

## 2012-01-31 DIAGNOSIS — Z940 Kidney transplant status: Secondary | ICD-10-CM | POA: Diagnosis not present

## 2012-01-31 DIAGNOSIS — M311 Thrombotic microangiopathy, unspecified: Secondary | ICD-10-CM | POA: Diagnosis not present

## 2012-01-31 DIAGNOSIS — I10 Essential (primary) hypertension: Secondary | ICD-10-CM | POA: Diagnosis not present

## 2012-02-07 DIAGNOSIS — Z48298 Encounter for aftercare following other organ transplant: Secondary | ICD-10-CM | POA: Diagnosis not present

## 2012-02-07 DIAGNOSIS — Z940 Kidney transplant status: Secondary | ICD-10-CM | POA: Diagnosis not present

## 2012-02-14 ENCOUNTER — Other Ambulatory Visit: Payer: Self-pay | Admitting: *Deleted

## 2012-02-14 DIAGNOSIS — N189 Chronic kidney disease, unspecified: Secondary | ICD-10-CM

## 2012-02-14 DIAGNOSIS — D631 Anemia in chronic kidney disease: Secondary | ICD-10-CM

## 2012-02-14 MED ORDER — EPOETIN ALFA 10000 UNIT/ML IJ SOLN: 20000.0000 [IU] | INTRAMUSCULAR | Status: DC

## 2012-02-21 DIAGNOSIS — D899 Disorder involving the immune mechanism, unspecified: Secondary | ICD-10-CM | POA: Diagnosis not present

## 2012-02-21 DIAGNOSIS — Z79899 Other long term (current) drug therapy: Secondary | ICD-10-CM | POA: Diagnosis not present

## 2012-03-11 DIAGNOSIS — E039 Hypothyroidism, unspecified: Secondary | ICD-10-CM | POA: Diagnosis not present

## 2012-03-11 DIAGNOSIS — Z940 Kidney transplant status: Secondary | ICD-10-CM | POA: Diagnosis not present

## 2012-03-11 DIAGNOSIS — Z79899 Other long term (current) drug therapy: Secondary | ICD-10-CM | POA: Diagnosis not present

## 2012-03-11 DIAGNOSIS — Z452 Encounter for adjustment and management of vascular access device: Secondary | ICD-10-CM | POA: Diagnosis not present

## 2012-03-11 DIAGNOSIS — E872 Acidosis, unspecified: Secondary | ICD-10-CM | POA: Diagnosis not present

## 2012-03-11 DIAGNOSIS — I10 Essential (primary) hypertension: Secondary | ICD-10-CM | POA: Diagnosis not present

## 2012-03-14 ENCOUNTER — Other Ambulatory Visit: Payer: Self-pay | Admitting: *Deleted

## 2012-03-14 DIAGNOSIS — D631 Anemia in chronic kidney disease: Secondary | ICD-10-CM

## 2012-03-14 MED ORDER — EPOETIN ALFA 10000 UNIT/ML IJ SOLN: 20000.0000 [IU] | INTRAMUSCULAR | Status: DC

## 2012-04-22 DIAGNOSIS — I10 Essential (primary) hypertension: Secondary | ICD-10-CM | POA: Diagnosis not present

## 2012-04-22 DIAGNOSIS — Z79899 Other long term (current) drug therapy: Secondary | ICD-10-CM | POA: Diagnosis not present

## 2012-04-22 DIAGNOSIS — E872 Acidosis, unspecified: Secondary | ICD-10-CM | POA: Diagnosis not present

## 2012-04-22 DIAGNOSIS — E039 Hypothyroidism, unspecified: Secondary | ICD-10-CM | POA: Diagnosis not present

## 2012-04-22 DIAGNOSIS — Z940 Kidney transplant status: Secondary | ICD-10-CM | POA: Diagnosis not present

## 2012-04-30 DIAGNOSIS — I120 Hypertensive chronic kidney disease with stage 5 chronic kidney disease or end stage renal disease: Secondary | ICD-10-CM | POA: Diagnosis not present

## 2012-04-30 DIAGNOSIS — D649 Anemia, unspecified: Secondary | ICD-10-CM | POA: Diagnosis not present

## 2012-04-30 DIAGNOSIS — Z940 Kidney transplant status: Secondary | ICD-10-CM | POA: Diagnosis not present

## 2012-04-30 DIAGNOSIS — N185 Chronic kidney disease, stage 5: Secondary | ICD-10-CM | POA: Diagnosis not present

## 2012-06-03 DIAGNOSIS — D649 Anemia, unspecified: Secondary | ICD-10-CM | POA: Diagnosis not present

## 2012-06-03 DIAGNOSIS — Z940 Kidney transplant status: Secondary | ICD-10-CM | POA: Diagnosis not present

## 2012-06-03 DIAGNOSIS — Z79899 Other long term (current) drug therapy: Secondary | ICD-10-CM | POA: Diagnosis not present

## 2012-06-03 DIAGNOSIS — N2581 Secondary hyperparathyroidism of renal origin: Secondary | ICD-10-CM | POA: Diagnosis not present

## 2012-06-14 DIAGNOSIS — Z79899 Other long term (current) drug therapy: Secondary | ICD-10-CM | POA: Diagnosis not present

## 2012-06-14 DIAGNOSIS — Z940 Kidney transplant status: Secondary | ICD-10-CM | POA: Diagnosis not present

## 2012-06-20 ENCOUNTER — Other Ambulatory Visit: Payer: Self-pay | Admitting: Obstetrics & Gynecology

## 2012-06-20 ENCOUNTER — Ambulatory Visit: Payer: Managed Care, Other (non HMO)

## 2012-06-20 ENCOUNTER — Ambulatory Visit (INDEPENDENT_AMBULATORY_CARE_PROVIDER_SITE_OTHER): Payer: Medicare Other

## 2012-06-20 DIAGNOSIS — Z1231 Encounter for screening mammogram for malignant neoplasm of breast: Secondary | ICD-10-CM

## 2012-07-01 DIAGNOSIS — Z940 Kidney transplant status: Secondary | ICD-10-CM | POA: Diagnosis not present

## 2012-07-01 DIAGNOSIS — D649 Anemia, unspecified: Secondary | ICD-10-CM | POA: Diagnosis not present

## 2012-07-01 DIAGNOSIS — Z79899 Other long term (current) drug therapy: Secondary | ICD-10-CM | POA: Diagnosis not present

## 2012-07-01 DIAGNOSIS — N2581 Secondary hyperparathyroidism of renal origin: Secondary | ICD-10-CM | POA: Diagnosis not present

## 2012-08-05 DIAGNOSIS — D649 Anemia, unspecified: Secondary | ICD-10-CM | POA: Diagnosis not present

## 2012-08-05 DIAGNOSIS — N2581 Secondary hyperparathyroidism of renal origin: Secondary | ICD-10-CM | POA: Diagnosis not present

## 2012-08-05 DIAGNOSIS — Z79899 Other long term (current) drug therapy: Secondary | ICD-10-CM | POA: Diagnosis not present

## 2012-08-05 DIAGNOSIS — Z940 Kidney transplant status: Secondary | ICD-10-CM | POA: Diagnosis not present

## 2012-08-28 DIAGNOSIS — E872 Acidosis, unspecified: Secondary | ICD-10-CM | POA: Diagnosis not present

## 2012-08-28 DIAGNOSIS — Z940 Kidney transplant status: Secondary | ICD-10-CM | POA: Diagnosis not present

## 2012-08-28 DIAGNOSIS — I10 Essential (primary) hypertension: Secondary | ICD-10-CM | POA: Diagnosis not present

## 2012-08-28 DIAGNOSIS — E039 Hypothyroidism, unspecified: Secondary | ICD-10-CM | POA: Diagnosis not present

## 2012-08-28 DIAGNOSIS — Z79899 Other long term (current) drug therapy: Secondary | ICD-10-CM | POA: Diagnosis not present

## 2012-10-21 NOTE — Telephone Encounter (Signed)
error 

## 2012-10-22 DIAGNOSIS — Z940 Kidney transplant status: Secondary | ICD-10-CM | POA: Diagnosis not present

## 2012-10-22 DIAGNOSIS — D649 Anemia, unspecified: Secondary | ICD-10-CM | POA: Diagnosis not present

## 2012-10-22 DIAGNOSIS — Z79899 Other long term (current) drug therapy: Secondary | ICD-10-CM | POA: Diagnosis not present

## 2012-10-22 DIAGNOSIS — N2581 Secondary hyperparathyroidism of renal origin: Secondary | ICD-10-CM | POA: Diagnosis not present

## 2012-11-13 DIAGNOSIS — N2581 Secondary hyperparathyroidism of renal origin: Secondary | ICD-10-CM | POA: Diagnosis not present

## 2012-11-13 DIAGNOSIS — N185 Chronic kidney disease, stage 5: Secondary | ICD-10-CM | POA: Diagnosis not present

## 2012-11-13 DIAGNOSIS — D649 Anemia, unspecified: Secondary | ICD-10-CM | POA: Diagnosis not present

## 2012-11-13 DIAGNOSIS — Z940 Kidney transplant status: Secondary | ICD-10-CM | POA: Diagnosis not present

## 2012-12-24 DIAGNOSIS — M311 Thrombotic microangiopathy, unspecified: Secondary | ICD-10-CM | POA: Diagnosis not present

## 2012-12-24 DIAGNOSIS — D899 Disorder involving the immune mechanism, unspecified: Secondary | ICD-10-CM | POA: Diagnosis not present

## 2012-12-24 DIAGNOSIS — E872 Acidosis, unspecified: Secondary | ICD-10-CM | POA: Diagnosis not present

## 2012-12-24 DIAGNOSIS — Z940 Kidney transplant status: Secondary | ICD-10-CM | POA: Diagnosis not present

## 2012-12-24 DIAGNOSIS — T8610 Unspecified complication of kidney transplant: Secondary | ICD-10-CM | POA: Diagnosis not present

## 2012-12-24 DIAGNOSIS — E039 Hypothyroidism, unspecified: Secondary | ICD-10-CM | POA: Diagnosis not present

## 2012-12-24 DIAGNOSIS — Z23 Encounter for immunization: Secondary | ICD-10-CM | POA: Diagnosis not present

## 2012-12-24 DIAGNOSIS — Z79899 Other long term (current) drug therapy: Secondary | ICD-10-CM | POA: Diagnosis not present

## 2012-12-24 DIAGNOSIS — D72819 Decreased white blood cell count, unspecified: Secondary | ICD-10-CM | POA: Diagnosis not present

## 2012-12-27 DIAGNOSIS — Z940 Kidney transplant status: Secondary | ICD-10-CM | POA: Diagnosis not present

## 2013-01-09 DIAGNOSIS — Z79899 Other long term (current) drug therapy: Secondary | ICD-10-CM | POA: Diagnosis not present

## 2013-01-09 DIAGNOSIS — E872 Acidosis, unspecified: Secondary | ICD-10-CM | POA: Diagnosis not present

## 2013-01-09 DIAGNOSIS — Z940 Kidney transplant status: Secondary | ICD-10-CM | POA: Diagnosis not present

## 2013-01-10 DIAGNOSIS — Z940 Kidney transplant status: Secondary | ICD-10-CM | POA: Diagnosis not present

## 2013-01-10 DIAGNOSIS — E872 Acidosis, unspecified: Secondary | ICD-10-CM | POA: Diagnosis not present

## 2013-01-10 DIAGNOSIS — Z79899 Other long term (current) drug therapy: Secondary | ICD-10-CM | POA: Diagnosis not present

## 2013-01-15 DIAGNOSIS — I10 Essential (primary) hypertension: Secondary | ICD-10-CM | POA: Diagnosis not present

## 2013-01-15 DIAGNOSIS — Z940 Kidney transplant status: Secondary | ICD-10-CM | POA: Diagnosis not present

## 2013-01-15 DIAGNOSIS — E872 Acidosis, unspecified: Secondary | ICD-10-CM | POA: Diagnosis not present

## 2013-01-15 DIAGNOSIS — Z79899 Other long term (current) drug therapy: Secondary | ICD-10-CM | POA: Diagnosis not present

## 2013-03-26 DIAGNOSIS — N185 Chronic kidney disease, stage 5: Secondary | ICD-10-CM | POA: Diagnosis not present

## 2013-03-26 DIAGNOSIS — Z23 Encounter for immunization: Secondary | ICD-10-CM | POA: Diagnosis not present

## 2013-03-26 DIAGNOSIS — E039 Hypothyroidism, unspecified: Secondary | ICD-10-CM | POA: Diagnosis not present

## 2013-03-26 DIAGNOSIS — D649 Anemia, unspecified: Secondary | ICD-10-CM | POA: Diagnosis not present

## 2013-03-26 DIAGNOSIS — Z940 Kidney transplant status: Secondary | ICD-10-CM | POA: Diagnosis not present

## 2013-04-17 DIAGNOSIS — L280 Lichen simplex chronicus: Secondary | ICD-10-CM | POA: Diagnosis not present

## 2013-04-17 DIAGNOSIS — D899 Disorder involving the immune mechanism, unspecified: Secondary | ICD-10-CM | POA: Diagnosis not present

## 2013-04-17 DIAGNOSIS — L738 Other specified follicular disorders: Secondary | ICD-10-CM | POA: Diagnosis not present

## 2013-04-17 DIAGNOSIS — Z940 Kidney transplant status: Secondary | ICD-10-CM | POA: Diagnosis not present

## 2013-06-09 ENCOUNTER — Other Ambulatory Visit: Payer: Self-pay | Admitting: Obstetrics & Gynecology

## 2013-06-09 ENCOUNTER — Other Ambulatory Visit (HOSPITAL_BASED_OUTPATIENT_CLINIC_OR_DEPARTMENT_OTHER): Payer: Self-pay | Admitting: Obstetrics & Gynecology

## 2013-06-09 DIAGNOSIS — Z1231 Encounter for screening mammogram for malignant neoplasm of breast: Secondary | ICD-10-CM

## 2013-06-25 DIAGNOSIS — Z48298 Encounter for aftercare following other organ transplant: Secondary | ICD-10-CM | POA: Diagnosis not present

## 2013-06-25 DIAGNOSIS — R809 Proteinuria, unspecified: Secondary | ICD-10-CM | POA: Diagnosis not present

## 2013-06-25 DIAGNOSIS — D899 Disorder involving the immune mechanism, unspecified: Secondary | ICD-10-CM | POA: Diagnosis not present

## 2013-06-25 DIAGNOSIS — IMO0002 Reserved for concepts with insufficient information to code with codable children: Secondary | ICD-10-CM | POA: Diagnosis not present

## 2013-06-25 DIAGNOSIS — E039 Hypothyroidism, unspecified: Secondary | ICD-10-CM | POA: Diagnosis not present

## 2013-06-25 DIAGNOSIS — N2589 Other disorders resulting from impaired renal tubular function: Secondary | ICD-10-CM | POA: Diagnosis not present

## 2013-06-25 DIAGNOSIS — Z940 Kidney transplant status: Secondary | ICD-10-CM | POA: Diagnosis not present

## 2013-06-25 DIAGNOSIS — Z792 Long term (current) use of antibiotics: Secondary | ICD-10-CM | POA: Diagnosis not present

## 2013-06-25 DIAGNOSIS — E872 Acidosis, unspecified: Secondary | ICD-10-CM | POA: Diagnosis not present

## 2013-06-25 DIAGNOSIS — I10 Essential (primary) hypertension: Secondary | ICD-10-CM | POA: Diagnosis not present

## 2013-06-25 DIAGNOSIS — Z862 Personal history of diseases of the blood and blood-forming organs and certain disorders involving the immune mechanism: Secondary | ICD-10-CM | POA: Diagnosis not present

## 2013-06-25 DIAGNOSIS — Z79899 Other long term (current) drug therapy: Secondary | ICD-10-CM | POA: Diagnosis not present

## 2013-07-01 ENCOUNTER — Ambulatory Visit (INDEPENDENT_AMBULATORY_CARE_PROVIDER_SITE_OTHER): Payer: BC Managed Care – PPO

## 2013-07-01 DIAGNOSIS — Z1231 Encounter for screening mammogram for malignant neoplasm of breast: Secondary | ICD-10-CM

## 2013-07-01 IMAGING — MG MM DIGITAL SCREENING
4 series · 4 of 4 positions shown · non-contrast
Comparison: Previous exam(s).

CLINICAL DATA: Screening.

EXAM:
DIGITAL SCREENING BILATERAL MAMMOGRAM WITH CAD

[R CC]
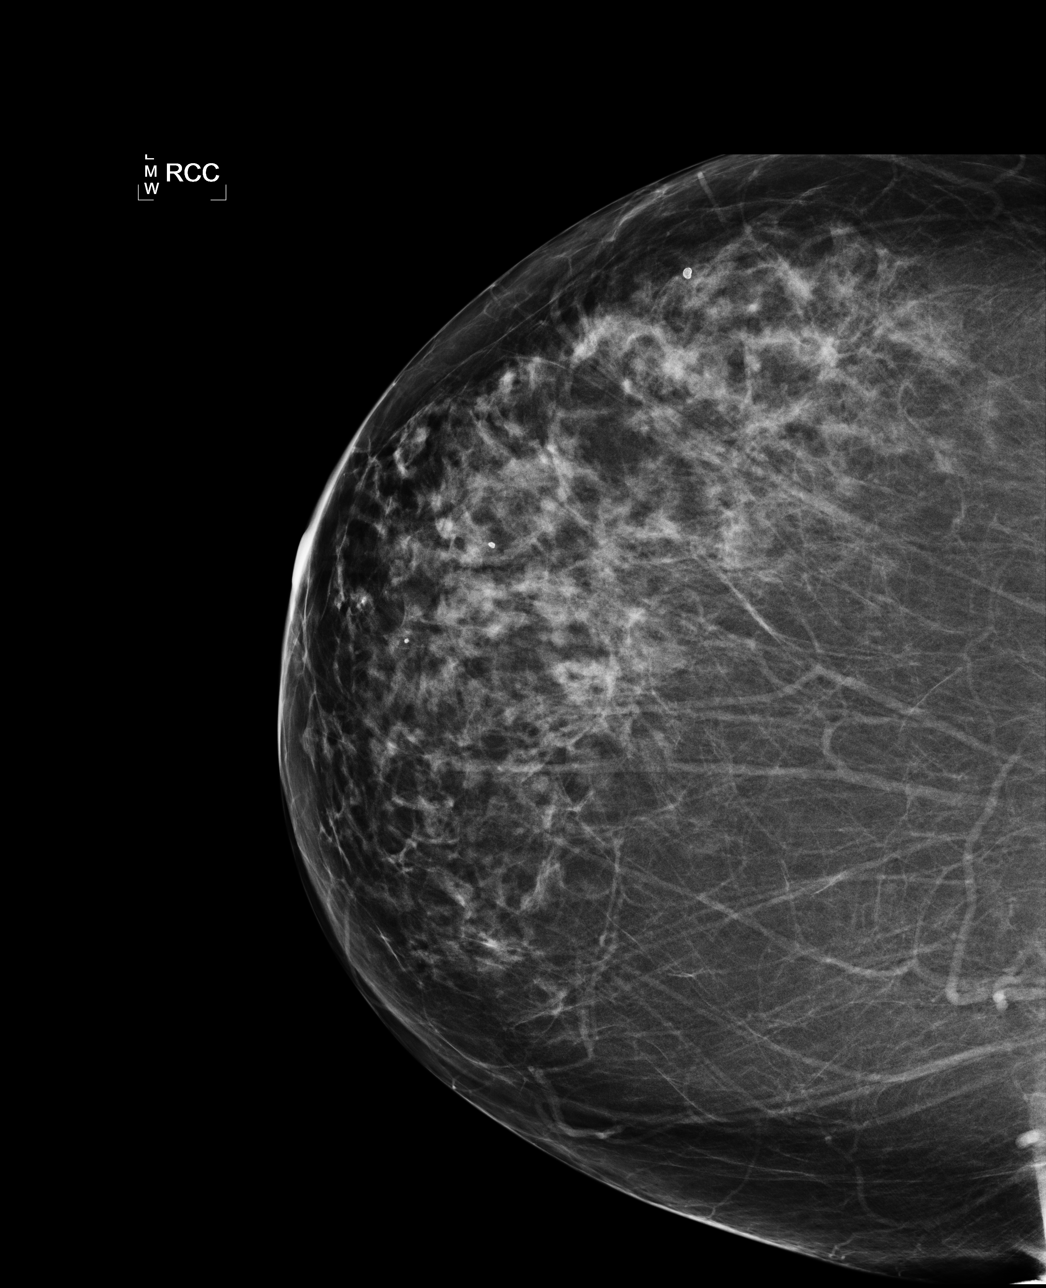

[L CC]
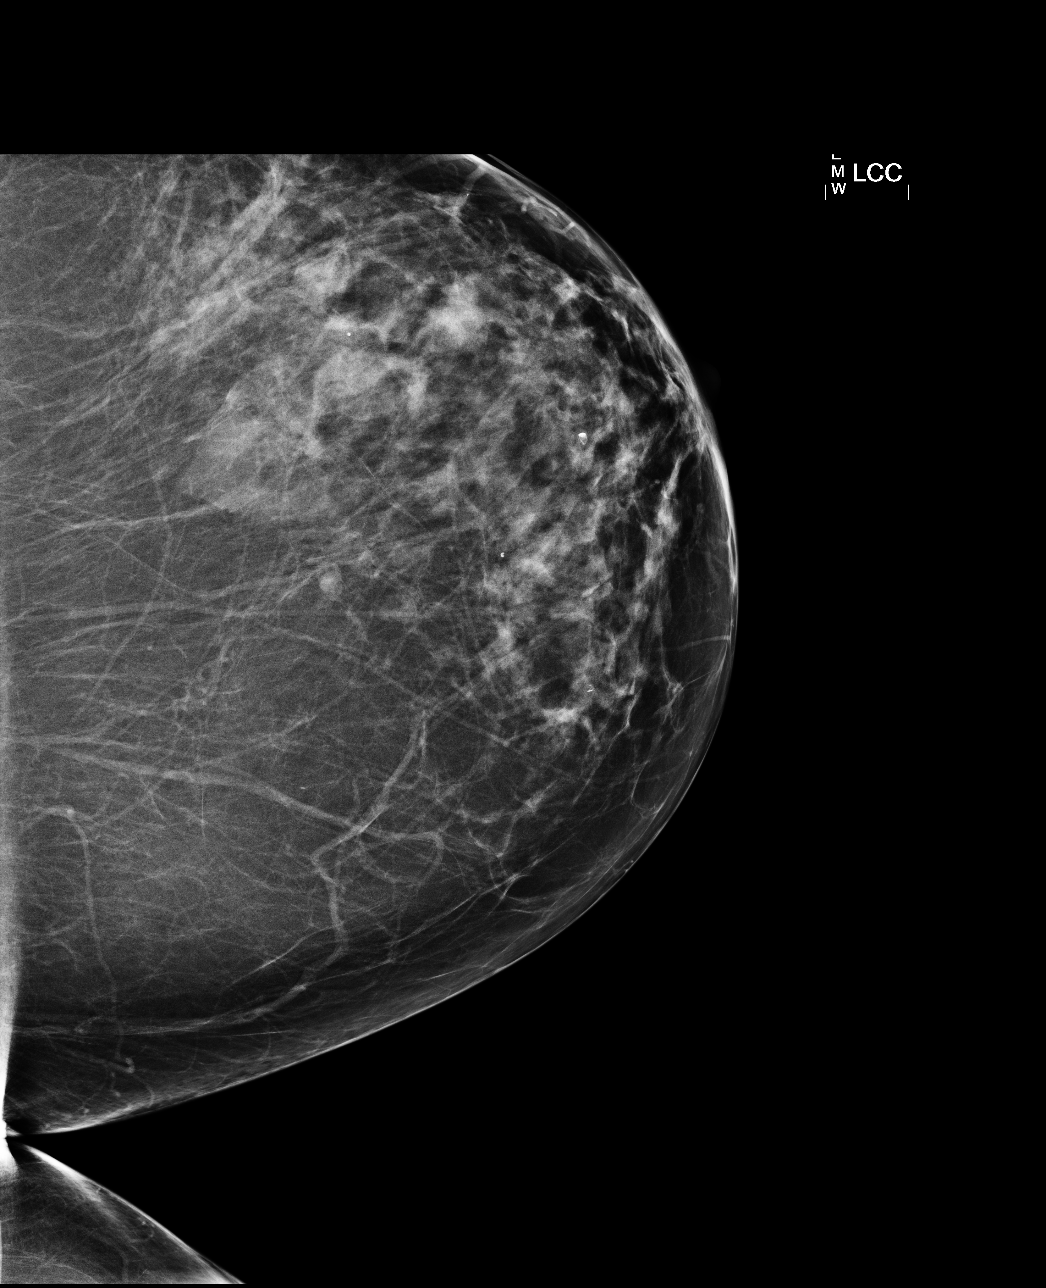

[L MLO]
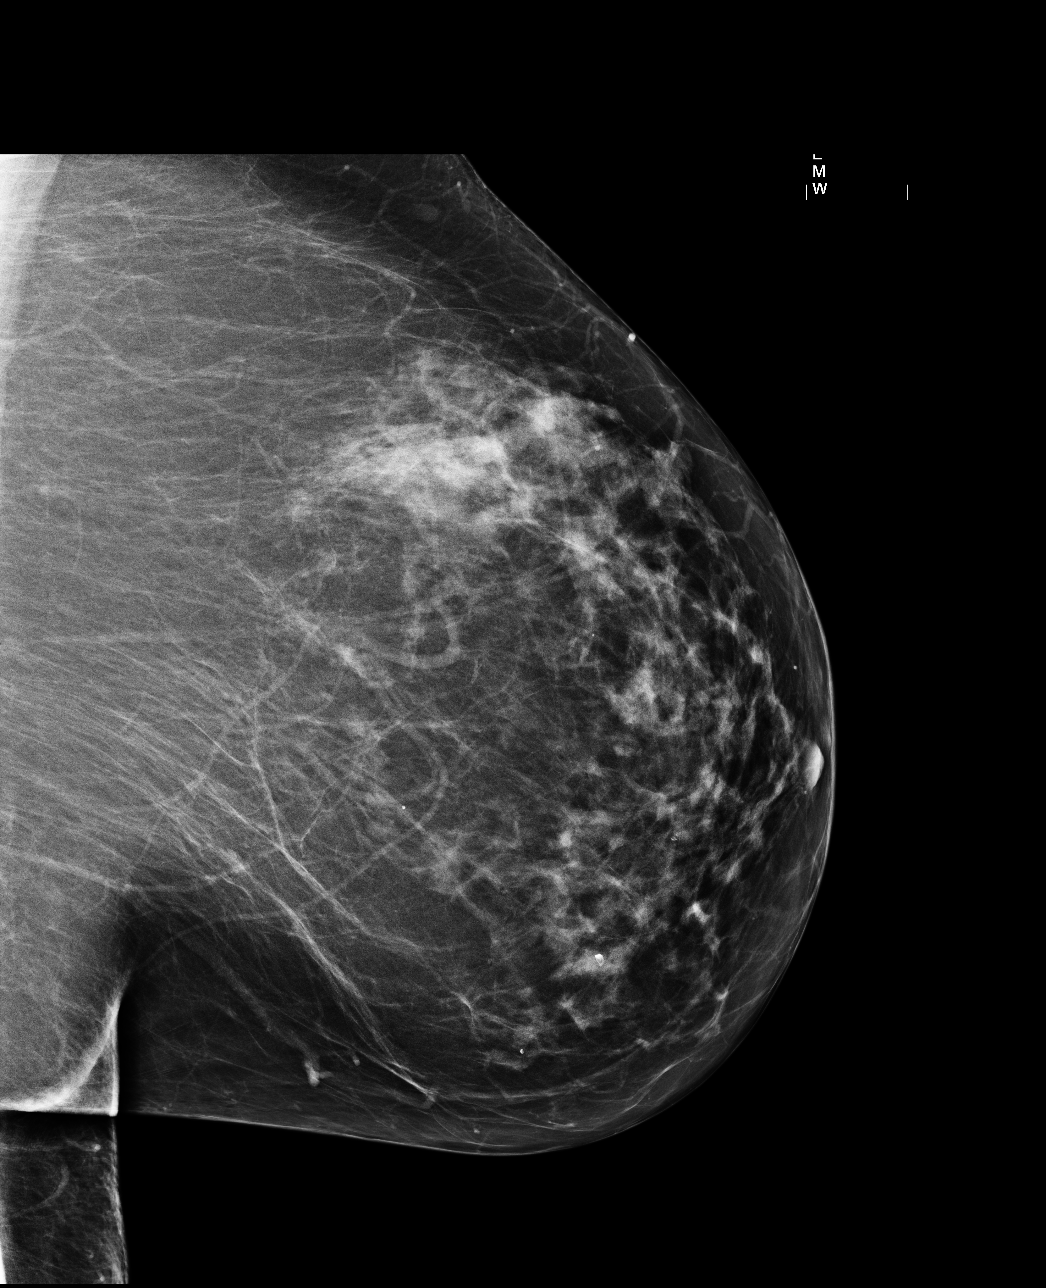

[R MLO]
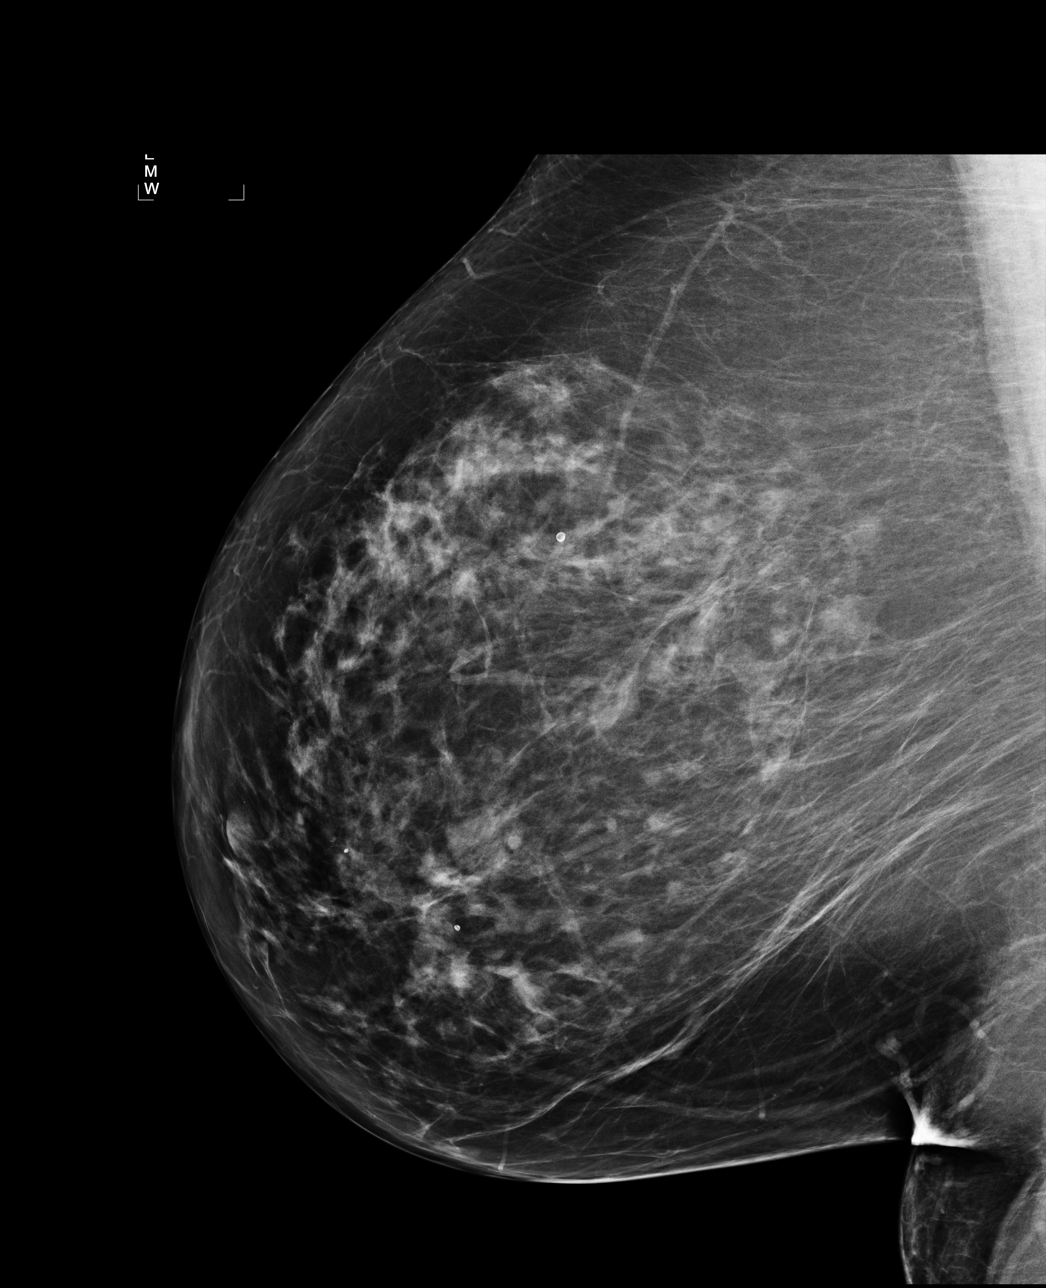

[4 of 4 positions shown; findings below may reference images not displayed]

ACR Breast Density Category c: The breast tissue is heterogeneously
dense, which may obscure small masses.
FINDINGS: There are no findings suspicious for malignancy. Images were
processed with CAD.
IMPRESSION: No mammographic evidence of malignancy. A result letter of this
screening mammogram will be mailed directly to the patient.

RECOMMENDATION:
Screening mammogram in one year. (Code:[0J])

BI-RADS CATEGORY  1: Negative.

## 2013-07-04 ENCOUNTER — Encounter: Payer: Self-pay | Admitting: Gastroenterology

## 2013-08-05 DIAGNOSIS — Z01419 Encounter for gynecological examination (general) (routine) without abnormal findings: Secondary | ICD-10-CM | POA: Diagnosis not present

## 2013-08-20 DIAGNOSIS — Z940 Kidney transplant status: Secondary | ICD-10-CM | POA: Diagnosis not present

## 2013-08-20 DIAGNOSIS — N185 Chronic kidney disease, stage 5: Secondary | ICD-10-CM | POA: Diagnosis not present

## 2013-08-20 DIAGNOSIS — E079 Disorder of thyroid, unspecified: Secondary | ICD-10-CM | POA: Diagnosis not present

## 2013-08-25 DIAGNOSIS — Z79899 Other long term (current) drug therapy: Secondary | ICD-10-CM | POA: Diagnosis not present

## 2013-08-25 DIAGNOSIS — IMO0002 Reserved for concepts with insufficient information to code with codable children: Secondary | ICD-10-CM | POA: Diagnosis not present

## 2013-08-25 DIAGNOSIS — Z792 Long term (current) use of antibiotics: Secondary | ICD-10-CM | POA: Diagnosis not present

## 2013-08-25 DIAGNOSIS — Z9889 Other specified postprocedural states: Secondary | ICD-10-CM | POA: Diagnosis not present

## 2013-08-25 DIAGNOSIS — Z8742 Personal history of other diseases of the female genital tract: Secondary | ICD-10-CM | POA: Diagnosis not present

## 2013-08-25 DIAGNOSIS — E039 Hypothyroidism, unspecified: Secondary | ICD-10-CM | POA: Diagnosis not present

## 2013-08-25 DIAGNOSIS — D899 Disorder involving the immune mechanism, unspecified: Secondary | ICD-10-CM | POA: Diagnosis not present

## 2013-08-25 DIAGNOSIS — Z90710 Acquired absence of both cervix and uterus: Secondary | ICD-10-CM | POA: Diagnosis not present

## 2013-08-25 DIAGNOSIS — I10 Essential (primary) hypertension: Secondary | ICD-10-CM | POA: Diagnosis not present

## 2013-08-25 DIAGNOSIS — Z940 Kidney transplant status: Secondary | ICD-10-CM | POA: Diagnosis not present

## 2014-01-07 DIAGNOSIS — Z940 Kidney transplant status: Secondary | ICD-10-CM | POA: Diagnosis not present

## 2014-01-07 DIAGNOSIS — Z79899 Other long term (current) drug therapy: Secondary | ICD-10-CM | POA: Diagnosis not present

## 2014-01-08 DIAGNOSIS — E2749 Other adrenocortical insufficiency: Secondary | ICD-10-CM | POA: Diagnosis not present

## 2014-01-08 DIAGNOSIS — E039 Hypothyroidism, unspecified: Secondary | ICD-10-CM | POA: Diagnosis not present

## 2014-02-25 DIAGNOSIS — Z940 Kidney transplant status: Secondary | ICD-10-CM | POA: Diagnosis not present

## 2014-02-25 DIAGNOSIS — N185 Chronic kidney disease, stage 5: Secondary | ICD-10-CM | POA: Diagnosis not present

## 2014-02-25 DIAGNOSIS — E079 Disorder of thyroid, unspecified: Secondary | ICD-10-CM | POA: Diagnosis not present

## 2014-02-25 DIAGNOSIS — Z23 Encounter for immunization: Secondary | ICD-10-CM | POA: Diagnosis not present

## 2014-04-23 DIAGNOSIS — Z8379 Family history of other diseases of the digestive system: Secondary | ICD-10-CM | POA: Diagnosis not present

## 2014-04-23 DIAGNOSIS — I10 Essential (primary) hypertension: Secondary | ICD-10-CM | POA: Diagnosis not present

## 2014-04-23 DIAGNOSIS — Z888 Allergy status to other drugs, medicaments and biological substances status: Secondary | ICD-10-CM | POA: Diagnosis not present

## 2014-04-23 DIAGNOSIS — E079 Disorder of thyroid, unspecified: Secondary | ICD-10-CM | POA: Diagnosis not present

## 2014-04-23 DIAGNOSIS — Z801 Family history of malignant neoplasm of trachea, bronchus and lung: Secondary | ICD-10-CM | POA: Diagnosis not present

## 2014-04-23 DIAGNOSIS — Z940 Kidney transplant status: Secondary | ICD-10-CM | POA: Diagnosis not present

## 2014-04-23 DIAGNOSIS — K219 Gastro-esophageal reflux disease without esophagitis: Secondary | ICD-10-CM | POA: Diagnosis not present

## 2014-04-23 DIAGNOSIS — M25561 Pain in right knee: Secondary | ICD-10-CM | POA: Diagnosis not present

## 2014-04-23 DIAGNOSIS — Z8249 Family history of ischemic heart disease and other diseases of the circulatory system: Secondary | ICD-10-CM | POA: Diagnosis not present

## 2014-05-12 DIAGNOSIS — B001 Herpesviral vesicular dermatitis: Secondary | ICD-10-CM | POA: Diagnosis not present

## 2014-05-12 DIAGNOSIS — B351 Tinea unguium: Secondary | ICD-10-CM | POA: Diagnosis not present

## 2014-05-12 DIAGNOSIS — L819 Disorder of pigmentation, unspecified: Secondary | ICD-10-CM | POA: Diagnosis not present

## 2014-05-12 DIAGNOSIS — L821 Other seborrheic keratosis: Secondary | ICD-10-CM | POA: Diagnosis not present

## 2014-05-12 DIAGNOSIS — I781 Nevus, non-neoplastic: Secondary | ICD-10-CM | POA: Diagnosis not present

## 2014-05-26 DIAGNOSIS — M25561 Pain in right knee: Secondary | ICD-10-CM | POA: Diagnosis not present

## 2014-06-06 DIAGNOSIS — M7051 Other bursitis of knee, right knee: Secondary | ICD-10-CM | POA: Diagnosis not present

## 2014-07-13 DIAGNOSIS — Z940 Kidney transplant status: Secondary | ICD-10-CM | POA: Diagnosis not present

## 2014-07-13 DIAGNOSIS — D899 Disorder involving the immune mechanism, unspecified: Secondary | ICD-10-CM | POA: Diagnosis not present

## 2014-07-13 DIAGNOSIS — E039 Hypothyroidism, unspecified: Secondary | ICD-10-CM | POA: Diagnosis not present

## 2014-07-13 DIAGNOSIS — E273 Drug-induced adrenocortical insufficiency: Secondary | ICD-10-CM | POA: Diagnosis not present

## 2014-07-13 DIAGNOSIS — Z7952 Long term (current) use of systemic steroids: Secondary | ICD-10-CM | POA: Diagnosis not present

## 2014-07-13 DIAGNOSIS — Z4822 Encounter for aftercare following kidney transplant: Secondary | ICD-10-CM | POA: Diagnosis not present

## 2014-07-13 DIAGNOSIS — Z6841 Body Mass Index (BMI) 40.0 and over, adult: Secondary | ICD-10-CM | POA: Diagnosis not present

## 2014-07-13 DIAGNOSIS — Z79899 Other long term (current) drug therapy: Secondary | ICD-10-CM | POA: Diagnosis not present

## 2014-07-13 DIAGNOSIS — T380X5D Adverse effect of glucocorticoids and synthetic analogues, subsequent encounter: Secondary | ICD-10-CM | POA: Diagnosis not present

## 2014-08-28 DIAGNOSIS — Z940 Kidney transplant status: Secondary | ICD-10-CM | POA: Diagnosis not present

## 2014-08-28 DIAGNOSIS — Z79899 Other long term (current) drug therapy: Secondary | ICD-10-CM | POA: Diagnosis not present

## 2014-08-28 DIAGNOSIS — N189 Chronic kidney disease, unspecified: Secondary | ICD-10-CM | POA: Diagnosis not present

## 2014-08-28 DIAGNOSIS — N390 Urinary tract infection, site not specified: Secondary | ICD-10-CM | POA: Diagnosis not present

## 2014-08-28 DIAGNOSIS — E785 Hyperlipidemia, unspecified: Secondary | ICD-10-CM | POA: Diagnosis not present

## 2014-09-08 DIAGNOSIS — E079 Disorder of thyroid, unspecified: Secondary | ICD-10-CM | POA: Diagnosis not present

## 2014-09-08 DIAGNOSIS — Z940 Kidney transplant status: Secondary | ICD-10-CM | POA: Diagnosis not present

## 2014-09-08 DIAGNOSIS — N185 Chronic kidney disease, stage 5: Secondary | ICD-10-CM | POA: Diagnosis not present

## 2014-09-25 ENCOUNTER — Other Ambulatory Visit: Payer: Self-pay | Admitting: Urology

## 2014-09-25 DIAGNOSIS — N3020 Other chronic cystitis without hematuria: Secondary | ICD-10-CM | POA: Diagnosis not present

## 2014-09-25 DIAGNOSIS — N183 Chronic kidney disease, stage 3 unspecified: Secondary | ICD-10-CM

## 2014-09-25 DIAGNOSIS — Z940 Kidney transplant status: Secondary | ICD-10-CM

## 2014-09-25 DIAGNOSIS — Z9489 Other transplanted organ and tissue status: Secondary | ICD-10-CM | POA: Diagnosis not present

## 2014-09-29 ENCOUNTER — Other Ambulatory Visit: Payer: Self-pay | Admitting: Obstetrics & Gynecology

## 2014-09-29 DIAGNOSIS — Z6841 Body Mass Index (BMI) 40.0 and over, adult: Secondary | ICD-10-CM | POA: Diagnosis not present

## 2014-09-29 DIAGNOSIS — Z124 Encounter for screening for malignant neoplasm of cervix: Secondary | ICD-10-CM | POA: Diagnosis not present

## 2014-09-29 DIAGNOSIS — Z1231 Encounter for screening mammogram for malignant neoplasm of breast: Secondary | ICD-10-CM | POA: Diagnosis not present

## 2014-09-29 DIAGNOSIS — Z1272 Encounter for screening for malignant neoplasm of vagina: Secondary | ICD-10-CM | POA: Diagnosis not present

## 2014-09-30 LAB — CYTOLOGY - PAP

## 2014-10-02 DIAGNOSIS — D899 Disorder involving the immune mechanism, unspecified: Secondary | ICD-10-CM | POA: Diagnosis not present

## 2014-10-02 DIAGNOSIS — Z940 Kidney transplant status: Secondary | ICD-10-CM | POA: Diagnosis not present

## 2014-10-09 DIAGNOSIS — N183 Chronic kidney disease, stage 3 (moderate): Secondary | ICD-10-CM | POA: Diagnosis not present

## 2014-10-09 DIAGNOSIS — Z940 Kidney transplant status: Secondary | ICD-10-CM | POA: Diagnosis not present

## 2014-10-09 DIAGNOSIS — N3020 Other chronic cystitis without hematuria: Secondary | ICD-10-CM | POA: Diagnosis not present

## 2014-10-09 DIAGNOSIS — Z9489 Other transplanted organ and tissue status: Secondary | ICD-10-CM | POA: Diagnosis not present

## 2014-10-13 ENCOUNTER — Other Ambulatory Visit: Payer: Medicare Other

## 2014-11-13 DIAGNOSIS — L814 Other melanin hyperpigmentation: Secondary | ICD-10-CM | POA: Diagnosis not present

## 2014-11-13 DIAGNOSIS — L853 Xerosis cutis: Secondary | ICD-10-CM | POA: Diagnosis not present

## 2014-11-13 DIAGNOSIS — D239 Other benign neoplasm of skin, unspecified: Secondary | ICD-10-CM | POA: Diagnosis not present

## 2014-11-13 DIAGNOSIS — I781 Nevus, non-neoplastic: Secondary | ICD-10-CM | POA: Diagnosis not present

## 2014-11-13 DIAGNOSIS — L821 Other seborrheic keratosis: Secondary | ICD-10-CM | POA: Diagnosis not present

## 2014-11-27 DIAGNOSIS — D899 Disorder involving the immune mechanism, unspecified: Secondary | ICD-10-CM | POA: Diagnosis not present

## 2014-11-27 DIAGNOSIS — Z940 Kidney transplant status: Secondary | ICD-10-CM | POA: Diagnosis not present

## 2015-01-04 DIAGNOSIS — N183 Chronic kidney disease, stage 3 (moderate): Secondary | ICD-10-CM | POA: Diagnosis not present

## 2015-01-04 DIAGNOSIS — E669 Obesity, unspecified: Secondary | ICD-10-CM | POA: Diagnosis not present

## 2015-01-04 DIAGNOSIS — T380X5S Adverse effect of glucocorticoids and synthetic analogues, sequela: Secondary | ICD-10-CM | POA: Diagnosis not present

## 2015-01-04 DIAGNOSIS — E039 Hypothyroidism, unspecified: Secondary | ICD-10-CM | POA: Diagnosis not present

## 2015-01-04 DIAGNOSIS — Z940 Kidney transplant status: Secondary | ICD-10-CM | POA: Diagnosis not present

## 2015-01-04 DIAGNOSIS — D899 Disorder involving the immune mechanism, unspecified: Secondary | ICD-10-CM | POA: Diagnosis not present

## 2015-01-04 DIAGNOSIS — N3020 Other chronic cystitis without hematuria: Secondary | ICD-10-CM | POA: Diagnosis not present

## 2015-01-04 DIAGNOSIS — E273 Drug-induced adrenocortical insufficiency: Secondary | ICD-10-CM | POA: Diagnosis not present

## 2015-01-04 DIAGNOSIS — Z9489 Other transplanted organ and tissue status: Secondary | ICD-10-CM | POA: Diagnosis not present

## 2015-01-08 DIAGNOSIS — R609 Edema, unspecified: Secondary | ICD-10-CM | POA: Diagnosis not present

## 2015-01-08 DIAGNOSIS — D899 Disorder involving the immune mechanism, unspecified: Secondary | ICD-10-CM | POA: Diagnosis not present

## 2015-01-08 DIAGNOSIS — Z940 Kidney transplant status: Secondary | ICD-10-CM | POA: Diagnosis not present

## 2015-01-08 DIAGNOSIS — I10 Essential (primary) hypertension: Secondary | ICD-10-CM | POA: Diagnosis not present

## 2015-09-02 ENCOUNTER — Encounter: Payer: Self-pay | Admitting: Gastroenterology

## 2015-09-16 ENCOUNTER — Encounter: Payer: Self-pay | Admitting: Gastroenterology

## 2015-11-19 ENCOUNTER — Ambulatory Visit (INDEPENDENT_AMBULATORY_CARE_PROVIDER_SITE_OTHER): Payer: BLUE CROSS/BLUE SHIELD | Admitting: Gastroenterology

## 2015-11-19 ENCOUNTER — Encounter: Payer: Self-pay | Admitting: Gastroenterology

## 2015-11-19 VITALS — BP 120/70 | HR 72 | Ht 63.5 in | Wt 253.1 lb

## 2015-11-19 DIAGNOSIS — R194 Change in bowel habit: Secondary | ICD-10-CM

## 2015-11-19 DIAGNOSIS — R198 Other specified symptoms and signs involving the digestive system and abdomen: Secondary | ICD-10-CM

## 2015-11-19 MED ORDER — NA SULFATE-K SULFATE-MG SULF 17.5-3.13-1.6 GM/177ML PO SOLN: ORAL | Status: DC

## 2015-11-19 NOTE — Progress Notes (Signed)
HPI: This is a  very pleasant 60 year old woman who was referred to me by Edrick Oh, MD  to evaluate  change in bowels, blood in stool .    Chief complaint is change in bowels, blood in stool     Colonoscopy, February 2007, Dr. Michela Pitcher, done for "anemia". He described normal terminal ileum, multiple pinpoint erosions from transverse to descending colon. Bx's showed "mild focal active ileitis" and also "active colitis and a tubular adenoma." He documented on the path report that he recommended a repeat colonoscopy in 2 years. EGD, February 2007, Dr. Lyla Son, done for anemia. He described 3 cm hiatal hernia, also 3 small antral polyps "not removed."    She is on many meds now.  Many have known GI side effects.  MMF can cause diarrhea.  She will awaken at 6am, small soft formed stools, 3 times. Then BM after every meal.  Up to 5-6 stools per day.  Never very loose.    She has seen streaks of red blood in the stool, none on TP.  No FH of colon cancer.  Has been post transplant kidney 4 years ago. Baseline Cr around 1.2.  She is not anemic that she knows about.  She had not tried imodium, fiber supplements, others.  Overall her weight is up 30 pounds since K transplant.  Review of systems: Pertinent positive and negative review of systems were noted in the above HPI section. Complete review of systems was performed and was otherwise normal.   Past Medical History  Diagnosis Date  . End stage renal disease ([place])   . Anemia   . GERD (gastroesophageal reflux disease)   . HTN (hypertension)   . Status post dilation of esophageal narrowing   . Hypothyroidism     Past Surgical History  Procedure Laterality Date  . Hysterectomy    . Kidney transplant  2013  . Cesarean section  1980, 1984  . Exploratory laparotomy  1983    Current Outpatient Prescriptions  Medication Sig Dispense Refill  . amLODipine (NORVASC) 5 MG tablet Take 5 mg by mouth daily.    Marland Kitchen  atorvastatin (LIPITOR) 10 MG tablet Take 10 mg by mouth daily.    . cyanocobalamin 1000 MCG tablet Take 1,000 mcg by mouth every other day.    . cycloSPORINE modified (NEORAL) 25 MG capsule Take 150 mg by mouth 2 (two) times daily.    . fluocinonide cream (LIDEX) [id number] % Apply 1 application topically 2 (two) times daily.    . folic acid (FOLVITE) 1 MG tablet Take 1 mg by mouth daily.    . furosemide (LASIX) 40 MG tablet Take 40 mg by mouth as needed.    Marland Kitchen levothyroxine (SYNTHROID, LEVOTHROID) 112 MCG tablet Take 112 mcg by mouth daily before breakfast.    . losartan (COZAAR) 25 MG tablet Take 25 mg by mouth daily.    . Magnesium Oxide (MAG-OXIDE PO) Take 266 mg by mouth 2 (two) times daily.    . multivitamin-lutein (OCUVITE-LUTEIN) CAPS Take 1 capsule by mouth daily.    . nitrofurantoin (MACRODANTIN) 50 MG capsule Take 50 mg by mouth.    Marland Kitchen omeprazole (PRILOSEC) 20 MG capsule Take 20 mg by mouth 2 (two) times daily before a meal.    . predniSONE (DELTASONE) 5 MG tablet Take 5 mg by mouth daily with breakfast.    . PREMARIN 1.25 MG tablet Take 1.25 mg by mouth Daily.    . sodium bicarbonate 650 MG  tablet Take 1,300 mg by mouth 3 (three) times daily.     Marland Kitchen sulfamethoxazole-trimethoprim (BACTRIM,SEPTRA) 400-80 MG tablet Take 1 tablet by mouth 3 (three) times a week.    . triamcinolone cream (KENALOG) 0.1 % Apply 1 application topically 2 (two) times daily.     No current facility-administered medications for this visit.    Allergies as of 11/19/2015 - Review Complete 11/19/2015  Allergen Reaction Noted  . Diovan [valsartan]  11/19/2015  . Erythromycin  11/19/2015  . Privigen [immune globulin (human)]  11/19/2015    Family History  Problem Relation Age of Onset  . Crohn's disease Son   . Heart disease Father   . Heart disease Mother   . Heart disease Paternal Grandfather     both sets of grandparents  . Kidney disease Maternal Grandmother   . Lung cancer Father     smoker    Social  History   Social History  . Marital Status: Married    Spouse Name: N/A  . Number of Children: 2  . Years of Education: N/A   Occupational History  . RN case Freight forwarder    Social History Main Topics  . Smoking status: Never Smoker   . Smokeless tobacco: Never Used  . Alcohol Use: No  . Drug Use: No  . Sexual Activity: Not on file   Other Topics Concern  . Not on file   Social History Narrative     Physical Exam: BP 120/70 mmHg  Pulse 72  Ht 5' 3.5" (1.613 m)  Wt 253 lb 2 oz (114.817 kg)  BMI 44.13 kg/m2 Constitutional: generally well-appearing Psychiatric: alert and oriented x3 Eyes: extraocular movements intact Mouth: oral pharynx moist, no lesions Neck: supple no lymphadenopathy Cardiovascular: heart regular rate and rhythm Lungs: clear to auscultation bilaterally Abdomen: soft, nontender, nondistended, no obvious ascites, no peritoneal signs, normal bowel sounds Extremities: no lower extremity edema bilaterally Skin: no lesions on visible extremities   Assessment and plan: 60 y.o. female with  Change in bowels, blood in stool  She had a kidney transplant 4 years ago and is on multiple medications for that. Many of them have GI side effects. She is noticing increase in stool frequency over the past 6 months or so. Never loose. Periodically blood-tinged. Outside labs show normal CBC, normal liver tests. I recommended we proceed with full colonoscopy at her soonest convenience. Her last one was about 10 years ago. I reviewed those records and honestly the report and pathology seem confusing to me. Her previous gastroenterologist did biopsies and one of these biopsies proved to be an adenoma. He recommended repeat colonoscopy 2 years later but she never had that.   Owens Loffler, MD [facility] 11/19/2015, 9:07 AM  Cc: Edrick Oh, MD

## 2015-11-19 NOTE — Patient Instructions (Addendum)
You have been scheduled for a colonoscopy. Please follow written instructions given to you at your visit today.  Please pick up your prep supplies at the pharmacy within the next 1-3 days. If you use inhalers (even only as needed), please bring them with you on the day of your procedure. Your physician has requested that you go to www.startemmi.com and enter the access code given to you at your visit today. This web site gives a general overview about your procedure. However, you should still follow specific instructions given to you by our office regarding your preparation for the procedure.  Please start taking citrucel (orange flavored) powder fiber supplement.  This may cause some bloating at first but that usually goes away. Begin with a small spoonful and work your way up to a large, heaping spoonful daily over a week.

## 2016-01-14 ENCOUNTER — Encounter: Payer: Self-pay | Admitting: Gastroenterology

## 2016-01-28 ENCOUNTER — Ambulatory Visit (AMBULATORY_SURGERY_CENTER): Payer: BLUE CROSS/BLUE SHIELD | Admitting: Gastroenterology

## 2016-01-28 ENCOUNTER — Encounter: Payer: Self-pay | Admitting: Gastroenterology

## 2016-01-28 VITALS — BP 96/48 | HR 56 | Temp 97.5°F | Resp 15 | Ht 63.5 in | Wt 253.0 lb

## 2016-01-28 DIAGNOSIS — R194 Change in bowel habit: Secondary | ICD-10-CM | POA: Diagnosis not present

## 2016-01-28 DIAGNOSIS — D125 Benign neoplasm of sigmoid colon: Secondary | ICD-10-CM | POA: Diagnosis not present

## 2016-01-28 MED ORDER — SODIUM CHLORIDE 0.9 % IV SOLN: 500.0000 mL | INTRAVENOUS | Status: DC

## 2016-01-28 NOTE — Progress Notes (Signed)
To recovery, report to Brown, RN, VSS. 

## 2016-01-28 NOTE — Op Note (Signed)
[place] Patient Name: Sandy Moore Procedure Date: 01/28/2016 1:17 PM MRN: [id number] Endoscopist: Milus Banister , MD Age: 60 Referring MD:  Date of Birth: [birth year] Gender: Female Account #: [id number] Procedure:                Colonoscopy Indications:              Change in bowel habits; Colonoscopy, February                            2007,Dr. Sam Maryanna Shape, done for "anemia". He                            described normal terminal ileum, multiple pinpoint                            erosions from transverse to descending colon. Bx's                            showed "mild focal active ileitis" and also "active                            colitis and a tubular adenoma." He documented on                            the path report that he recommended a repeat                            colonoscopy in 2 years. Medicines:                Monitored Anesthesia Care Procedure:                Pre-Anesthesia Assessment:                           - Prior to the procedure, a History and Physical                            was performed, and patient medications and                            allergies were reviewed. The patient's tolerance of                            previous anesthesia was also reviewed. The risks                            and benefits of the procedure and the sedation                            options and risks were discussed with the patient.                            All questions were answered, and informed consent  was obtained. Prior Anticoagulants: The patient has                            taken no previous anticoagulant or antiplatelet                            agents. ASA Grade Assessment: III - A patient with                            severe systemic disease. After reviewing the risks                            and benefits, the patient was deemed in                            satisfactory condition to undergo the  procedure.                           After obtaining informed consent, the colonoscope                            was passed under direct vision. Throughout the                            procedure, the patient's blood pressure, pulse, and                            oxygen saturations were monitored continuously. The                            Model CF-HQ190L [id number]) scope was introduced                            through the anus and advanced to the the terminal                            ileum. The colonoscopy was performed without                            difficulty. The patient tolerated the procedure                            well. The quality of the bowel preparation was                            good. The terminal ileum, ileocecal valve,                            appendiceal orifice, and rectum were photographed. Scope In: 1:20:53 PM Scope Out: 1:40:48 PM Scope Withdrawal Time: 0 hours 15 minutes 48 seconds  Total Procedure Duration: 0 hours 19 minutes 55 seconds  Findings:                 The terminal ileum appeared normal.  Mild inflammation characterized by friability and                            granularity was found in the rectum, in the sigmoid                            colon, in the descending colon and at the splenic                            flexure. Biopsies were taken with a cold forceps                            for histology. The right colon was also biopsied                            (separate jar)                           A 27 mm polyp was found in the proximal sigmoid                            colon. The polyp was pedunculated. The polyp was                            removed with a hot snare. Resection and retrieval                            were complete. Area was tattooed with an injection                            of [place] ink.                           A 18 mm polyp was found in the distal sigmoid                             colon. The polyp was pedunculated. The polyp was                            removed with a hot snare. Resection and retrieval                            were complete. Area was tattooed with an injection                            of [place] ink.                           External and internal hemorrhoids were found during                            retroflexion and during perianal exam. The  hemorrhoids were medium-sized. Complications:            No immediate complications. Estimated blood loss:                            None. Estimated Blood Loss:     Estimated blood loss: none. Impression:               - The examined portion of the ileum was normal.                           - Mild inflammation was found in the rectum, in the                            sigmoid colon, in the descending colon and at the                            splenic flexure. Biopsied (Jar 2). The right colon                            was also biopsied (separate jar 1)                           - One 27 mm polyp in the proximal sigmoid colon,                            removed with a hot snare. Resected and retrieved                            (jar 3). Tattooed.                           - One 18 mm polyp in the distal sigmoid colon,                            removed with a hot snare. Resected and retrieved                            (jar 4). Tattooed.                           - External and internal hemorrhoids. Recommendation:           - Patient has a contact number available for                            emergencies. The signs and symptoms of potential                            delayed complications were discussed with the                            patient. Return to normal activities tomorrow.  Written discharge instructions were provided to the                            patient.                           - Resume previous diet.                            - Continue present medications.                           You will receive a letter within 2-3 weeks with the                            pathology results and my final recommendations.                           If the polyp(s) is proven to be 'pre-cancerous' on                            pathology, you will need repeat colonoscopy in 3                            years. If the polyp(s) is NOT 'precancerous' on                            pathology then you should repeat colon cancer                            screening in 10 years with colonoscopy without need                            for colon cancer screening by any method prior to                            then (including stool testing). Milus Banister, MD 01/28/2016 1:49:02 PM This report has been signed electronically.

## 2016-01-28 NOTE — Progress Notes (Signed)
As soon as patient arrived to recovery she stated that she was hurting despite expelling air. Rated pain at 8 and stated that it feels like punches in stomach. Dr. Ardis Hughs made aware verbal  order received for hyoscyamine sulfate 0.125 mg given sl per order @ 1354.PT.  Continues to expel air,pt.  Assisted to knee position and she continues to expel air. Pt. Stated that pain has decreased from 8 to 7 and stated it is getting better.1410 pain level decreased to 5.1413 pt. Ambulated around in recovery.pt. Continues to burp and expel air.1415. Pt. Stated that  pain is better than it was when she first come out. Pt. Assisted to bathroom.1426 pt. Continues to expel air Dr. Ardis Hughs in and aware,pt. Stated that pain has decrease to 4,allowing pt. To get dressed and placed in consultation room to allow more time per doctor jacobs. Order.[place] Dr. Ardis Hughs in to evaluate pt. Abdomen remained soft. Pt. Stated that she does not want to go to E.R.1444 Pt. Stated that she feels that she can go home now that the pain has decreased to a 2 and it is just gas,instructed pt. To call if she has any problems over the weekend,she verbalize understanding.rest of pt. Vital signs were erased. Pt. Stable upon discharge. Dr. Ardis Hughs seen pt. Prior to discharge and was o.k. To discharge pt.

## 2016-01-28 NOTE — Patient Instructions (Signed)
YOU HAD AN ENDOSCOPIC PROCEDURE TODAY AT THE [facility]:   Refer to the procedure report that was given to you for any specific questions about what was found during the examination.  If the procedure report does not answer your questions, please call your gastroenterologist to clarify.  If you requested that your care partner not be given the details of your procedure findings, then the procedure report has been included in a sealed envelope for you to review at your convenience later.  YOU SHOULD EXPECT: Some feelings of bloating in the abdomen. Passage of more gas than usual.  Walking can help get rid of the air that was put into your GI tract during the procedure and reduce the bloating. If you had a lower endoscopy (such as a colonoscopy or flexible sigmoidoscopy) you may notice spotting of blood in your stool or on the toilet paper. If you underwent a bowel prep for your procedure, you may not have a normal bowel movement for a few days.  Please Note:  You might notice some irritation and congestion in your nose or some drainage.  This is from the oxygen used during your procedure.  There is no need for concern and it should clear up in a day or so.  SYMPTOMS TO REPORT IMMEDIATELY:   Following lower endoscopy (colonoscopy or flexible sigmoidoscopy):  Excessive amounts of blood in the stool  Significant tenderness or worsening of abdominal pains  Swelling of the abdomen that is new, acute  Fever of 100F or higher   For urgent or emergent issues, a gastroenterologist can be reached at any hour by calling [phone].   DIET:  We do recommend a small meal at first, but then you may proceed to your regular diet.  Drink plenty of fluids but you should avoid alcoholic beverages for 24 hours.  ACTIVITY:  You should plan to take it easy for the rest of today and you should NOT DRIVE or use heavy machinery until tomorrow (because of the sedation medicines used during the test).     FOLLOW UP: Our staff will call the number listed on your records the next business day following your procedure to check on you and address any questions or concerns that you may have regarding the information given to you following your procedure. If we do not reach you, we will leave a message.  However, if you are feeling well and you are not experiencing any problems, there is no need to return our call.  We will assume that you have returned to your regular daily activities without incident.  If any biopsies were taken you will be contacted by phone or by letter within the next 1-3 weeks.  Please call us at [phone] if you have not heard about the biopsies in 3 weeks.    SIGNATURES/CONFIDENTIALITY: You and/or your care partner have signed paperwork which will be entered into your electronic medical record.  These signatures attest to the fact that that the information above on your After Visit Summary has been reviewed and is understood.  Full responsibility of the confidentiality of this discharge information lies with you and/or your care-partner.   Resume medications. Information given on polyps,hemorrhoids and high fiber diet. 

## 2016-01-31 ENCOUNTER — Telehealth: Payer: Self-pay | Admitting: *Deleted

## 2016-01-31 NOTE — Telephone Encounter (Signed)
  Follow up Call-  Call back number 01/28/2016  Post procedure Call Back phone  # [phone] cell  Permission to leave phone message Yes  Some recent data might be hidden     Patient questions:  Do you have a fever, pain , or abdominal swelling? No. Pain Score  0 *  Have you tolerated food without any problems? Yes.    Have you been able to return to your normal activities? Yes.    Do you have any questions about your discharge instructions: Diet   No. Medications  No. Follow up visit  No.  Do you have questions or concerns about your Care? No.  Actions: * If pain score is 4 or above: No action needed, pain <4. Stated I am good now.

## 2016-02-06 ENCOUNTER — Encounter: Payer: Self-pay | Admitting: Gastroenterology

## 2016-02-15 ENCOUNTER — Telehealth: Payer: Self-pay | Admitting: *Deleted

## 2016-02-15 NOTE — Telephone Encounter (Signed)
Called pt and advised of results and recall from colonoscopy on 01/28/16 per Dr Ardis Hughs request-adm

## 2016-11-10 ENCOUNTER — Telehealth: Payer: Self-pay | Admitting: Gastroenterology

## 2016-11-10 NOTE — Telephone Encounter (Signed)
Pt has been scheduled to see Dr Carlean Purl to discuss banding.

## 2016-12-08 ENCOUNTER — Encounter: Payer: Self-pay | Admitting: Internal Medicine

## 2016-12-08 ENCOUNTER — Ambulatory Visit (INDEPENDENT_AMBULATORY_CARE_PROVIDER_SITE_OTHER): Payer: BLUE CROSS/BLUE SHIELD | Admitting: Internal Medicine

## 2016-12-08 VITALS — BP 132/80 | HR 80 | Ht 63.5 in | Wt 240.0 lb

## 2016-12-08 DIAGNOSIS — K641 Second degree hemorrhoids: Secondary | ICD-10-CM | POA: Diagnosis not present

## 2016-12-08 HISTORY — DX: Second degree hemorrhoids: K64.1

## 2016-12-08 NOTE — Patient Instructions (Signed)
  HEMORRHOID BANDING PROCEDURE    FOLLOW-UP CARE   1. The procedure you have had should have been relatively painless since the banding of the area involved does not have nerve endings and there is no pain sensation.  The rubber band cuts off the blood supply to the hemorrhoid and the band may fall off as soon as 48 hours after the banding (the band may occasionally be seen in the toilet bowl following a bowel movement). You may notice a temporary feeling of fullness in the rectum which should respond adequately to plain Tylenol or Motrin.  2. Following the banding, avoid strenuous exercise that evening and resume full activity the next day.  A sitz bath (soaking in a warm tub) or bidet is soothing, and can be useful for cleansing the area after bowel movements.     3. To avoid constipation, take two tablespoons of natural wheat bran, natural oat bran, flax, Benefiber or any over the counter fiber supplement and increase your water intake to 7-8 glasses daily.    4. Unless you have been prescribed anorectal medication, do not put anything inside your rectum for two weeks: No suppositories, enemas, fingers, etc.  5. Occasionally, you may have more bleeding than usual after the banding procedure.  This is often from the untreated hemorrhoids rather than the treated one.  Don't be concerned if there is a tablespoon or so of blood.  If there is more blood than this, lie flat with your bottom higher than your head and apply an ice pack to the area. If the bleeding does not stop within a half an hour or if you feel faint, call our office at [phone] or go to the emergency room.  6. Problems are not common; however, if there is a substantial amount of bleeding, severe pain, chills, fever or difficulty passing urine (very rare) or other problems, you should call us at [phone] or report to the nearest emergency room.  7. Do not stay seated continuously for more than 2-3 hours for a day or  two after the procedure.  Tighten your buttock muscles 10-15 times every two hours and take 10-15 deep breaths every 1-2 hours.  Do not spend more than a few minutes on the toilet if you cannot empty your bowel; instead re-visit the toilet at a later time.    We will see you back for more banding on 12/29/16 at 2:30pm.   I appreciate the opportunity to care for you. Silvano Rusk, MD, [facility]

## 2016-12-08 NOTE — Assessment & Plan Note (Signed)
Right anterior band today return in about 3 weeks for repeat banding

## 2016-12-08 NOTE — Progress Notes (Signed)
  Hemorrhoid ligation  Symptoms are anal soreness, occasional fecal seepage prolapse symptoms.  Rectal exam with Magdalene River CMA present shows small fleshy anal tags and a normal anoderm. Normal digital exam slightly tender. No sign of fissure.  Anoscopy is performed demonstrating grade 2 internal hemorrhoids in all 3 positions.  PROCEDURE NOTE: The patient presents with symptomatic grade 2  hemorrhoids, requesting rubber band ligation of his/her hemorrhoidal disease.  All risks, benefits and alternative forms of therapy were described and informed consent was obtained.   The anorectum was pre-medicated with 0.125% NTG and 5% lidocaine The decision was made to band the RA internal hemorrhoid, and the [place] was used to perform band ligation without complication.  Digital anorectal examination was then performed to assure proper positioning of the band, and to adjust the banded tissue as required.  The patient was discharged home without pain or other issues.  Dietary and behavioral recommendations were given and along with follow-up instructions.       The patient will return in about 3 weeks for  follow-up and possible additional banding as required. No complications were encountered and the patient tolerated the procedure well.  I appreciate the opportunity to care for this patient. CC: Edrick Oh, MD

## 2016-12-29 ENCOUNTER — Encounter: Payer: Self-pay | Admitting: Internal Medicine

## 2016-12-29 ENCOUNTER — Ambulatory Visit (INDEPENDENT_AMBULATORY_CARE_PROVIDER_SITE_OTHER): Payer: BLUE CROSS/BLUE SHIELD | Admitting: Internal Medicine

## 2016-12-29 DIAGNOSIS — K641 Second degree hemorrhoids: Secondary | ICD-10-CM

## 2016-12-29 NOTE — Progress Notes (Signed)
    Sxs:   Edward Qualia PA-S present  Rectal - firm small-med tags left and some right small fleshy ones  PROCEDURE NOTE: The patient presents with symptomatic grade 2 hemorrhoids, requesting rubber band ligation of his/her hemorrhoidal disease.  All risks, benefits and alternative forms of therapy were described and informed consent was obtained.   The anorectum was pre-medicated with 0.125% NTG and 5% lidoacaine The decision was made to band the RP and LL internal hemorrhoids, and the [place] was used to perform band ligation without complication.  Digital anorectal examination was then performed to assure proper positioning of the band, and to adjust the banded tissue as required.  The patient was discharged home without pain or other issues.  Dietary and behavioral recommendations were given and along with follow-up instructions.       The patient will return in 2 mos for  follow-up and possible additional banding as required. No complications were encountered and the patient tolerated the procedure well.

## 2016-12-29 NOTE — Assessment & Plan Note (Signed)
RP and LL banded RTC 2 mos

## 2016-12-29 NOTE — Patient Instructions (Addendum)
   HEMORRHOID BANDING PROCEDURE    FOLLOW-UP CARE   1. The procedure you have had should have been relatively painless since the banding of the area involved does not have nerve endings and there is no pain sensation.  The rubber band cuts off the blood supply to the hemorrhoid and the band may fall off as soon as 48 hours after the banding (the band may occasionally be seen in the toilet bowl following a bowel movement). You may notice a temporary feeling of fullness in the rectum which should respond adequately to plain Tylenol or Motrin.  2. Following the banding, avoid strenuous exercise that evening and resume full activity the next day.  A sitz bath (soaking in a warm tub) or bidet is soothing, and can be useful for cleansing the area after bowel movements.     3. To avoid constipation, take two tablespoons of natural wheat bran, natural oat bran, flax, Benefiber or any over the counter fiber supplement and increase your water intake to 7-8 glasses daily.    4. Unless you have been prescribed anorectal medication, do not put anything inside your rectum for two weeks: No suppositories, enemas, fingers, etc.  5. Occasionally, you may have more bleeding than usual after the banding procedure.  This is often from the untreated hemorrhoids rather than the treated one.  Don't be concerned if there is a tablespoon or so of blood.  If there is more blood than this, lie flat with your bottom higher than your head and apply an ice pack to the area. If the bleeding does not stop within a half an hour or if you feel faint, call our office at [phone] or go to the emergency room.  6. Problems are not common; however, if there is a substantial amount of bleeding, severe pain, chills, fever or difficulty passing urine (very rare) or other problems, you should call us at [phone] or report to the nearest emergency room.  7. Do not stay seated continuously for more than 2-3 hours for a  day or two after the procedure.  Tighten your buttock muscles 10-15 times every two hours and take 10-15 deep breaths every 1-2 hours.  Do not spend more than a few minutes on the toilet if you cannot empty your bowel; instead re-visit the toilet at a later time.   Please call back for a follow up in 2 months. If you are satisfied with symptom control you may cancel that appointment.  I appreciate the opportunity to care for you. Sandy Mayer, MD, [facility]

## 2018-07-10 ENCOUNTER — Ambulatory Visit: Payer: BLUE CROSS/BLUE SHIELD | Admitting: Cardiology

## 2018-07-12 ENCOUNTER — Ambulatory Visit: Payer: BLUE CROSS/BLUE SHIELD | Admitting: Cardiology

## 2019-01-06 ENCOUNTER — Encounter: Payer: Self-pay | Admitting: Gastroenterology

## 2019-04-18 ENCOUNTER — Ambulatory Visit (AMBULATORY_SURGERY_CENTER): Payer: BC Managed Care – PPO | Admitting: *Deleted

## 2019-04-18 ENCOUNTER — Other Ambulatory Visit: Payer: Self-pay

## 2019-04-18 VITALS — Temp 96.9°F | Ht 63.0 in | Wt 218.4 lb

## 2019-04-18 DIAGNOSIS — Z86010 Personal history of colonic polyps: Secondary | ICD-10-CM

## 2019-04-18 DIAGNOSIS — Z1159 Encounter for screening for other viral diseases: Secondary | ICD-10-CM

## 2019-04-18 MED ORDER — SUPREP BOWEL PREP KIT 17.5-3.13-1.6 GM/177ML PO SOLN: 1.0000 | Freq: Once | ORAL | 0 refills | Status: AC

## 2019-04-18 NOTE — Progress Notes (Signed)
No egg or soy allergy known to patient  No issues with past sedation with any surgeries  or procedures, no intubation problems  No diet pills per patient No home 02 use per patient  No blood thinners per patient  Pt denies issues with constipation  No A fib or A flutter  EMMI video sent to pt's e mail   Covid test 11-18 at 825 am  Due to the COVID-19 pandemic we are asking patients to follow these guidelines. Please only bring one care partner. Please be aware that your care partner may wait in the car in the parking lot or if they feel like they will be too hot to wait in the car, they may wait in the lobby on the 4th floor. All care partners are required to wear a mask the entire time (we do not have any that we can provide them), they need to practice social distancing, and we will do a Covid check for all patient's and care partners when you arrive. Also we will check their temperature and your temperature. If the care partner waits in their car they need to stay in the parking lot the entire time and we will call them on their cell phone when the patient is ready for discharge so they can bring the car to the front of the building. Also all patient's will need to wear a mask into building.

## 2019-04-21 ENCOUNTER — Encounter: Payer: Self-pay | Admitting: Gastroenterology

## 2019-04-29 ENCOUNTER — Telehealth: Payer: Self-pay | Admitting: Gastroenterology

## 2019-04-29 NOTE — Telephone Encounter (Signed)
Left message for pt that she needs to keep the app as rapid tests sometimes give a false negative result.

## 2019-04-30 ENCOUNTER — Other Ambulatory Visit: Payer: Self-pay | Admitting: Gastroenterology

## 2019-04-30 ENCOUNTER — Ambulatory Visit (INDEPENDENT_AMBULATORY_CARE_PROVIDER_SITE_OTHER): Payer: BC Managed Care – PPO

## 2019-04-30 DIAGNOSIS — Z1159 Encounter for screening for other viral diseases: Secondary | ICD-10-CM

## 2019-04-30 LAB — SARS CORONAVIRUS 2 (TAT 6-24 HRS): SARS Coronavirus 2: NEGATIVE

## 2019-05-05 ENCOUNTER — Other Ambulatory Visit: Payer: Self-pay

## 2019-05-05 ENCOUNTER — Encounter: Payer: Self-pay | Admitting: Gastroenterology

## 2019-05-05 ENCOUNTER — Ambulatory Visit (AMBULATORY_SURGERY_CENTER): Payer: BC Managed Care – PPO | Admitting: Gastroenterology

## 2019-05-05 VITALS — BP 147/75 | HR 66 | Temp 97.9°F | Resp 12 | Ht 63.0 in | Wt 218.4 lb

## 2019-05-05 DIAGNOSIS — Z86010 Personal history of colonic polyps: Secondary | ICD-10-CM

## 2019-05-05 MED ORDER — SODIUM CHLORIDE 0.9 % IV SOLN: 500.0000 mL | Freq: Once | INTRAVENOUS | Status: DC

## 2019-05-05 NOTE — Patient Instructions (Signed)
Thank you for letting us take care of your healthcare needs today. See handouts given to you on Diverticulosis and Hemorrhoids.    YOU HAD AN ENDOSCOPIC PROCEDURE TODAY AT [facility]:   Refer to the procedure report that was given to you for any specific questions about what was found during the examination.  If the procedure report does not answer your questions, please call your gastroenterologist to clarify.  If you requested that your care partner not be given the details of your procedure findings, then the procedure report has been included in a sealed envelope for you to review at your convenience later.  YOU SHOULD EXPECT: Some feelings of bloating in the abdomen. Passage of more gas than usual.  Walking can help get rid of the air that was put into your GI tract during the procedure and reduce the bloating. If you had a lower endoscopy (such as a colonoscopy or flexible sigmoidoscopy) you may notice spotting of blood in your stool or on the toilet paper. If you underwent a bowel prep for your procedure, you may not have a normal bowel movement for a few days.  Please Note:  You might notice some irritation and congestion in your nose or some drainage.  This is from the oxygen used during your procedure.  There is no need for concern and it should clear up in a day or so.  SYMPTOMS TO REPORT IMMEDIATELY:   Following lower endoscopy (colonoscopy or flexible sigmoidoscopy):  Excessive amounts of blood in the stool  Significant tenderness or worsening of abdominal pains  Swelling of the abdomen that is new, acute  Fever of 100F or higher   For urgent or emergent issues, a gastroenterologist can be reached at any hour by calling [phone].   DIET:  We do recommend a small meal at first, but then you may proceed to your regular diet.  Drink plenty of fluids but you should avoid alcoholic beverages for 24 hours.  ACTIVITY:  You should plan to take it easy for the  rest of today and you should NOT DRIVE or use heavy machinery until tomorrow (because of the sedation medicines used during the test).    FOLLOW UP: Our staff will call the number listed on your records 48-72 hours following your procedure to check on you and address any questions or concerns that you may have regarding the information given to you following your procedure. If we do not reach you, we will leave a message.  We will attempt to reach you two times.  During this call, we will ask if you have developed any symptoms of COVID 19. If you develop any symptoms (ie: fever, flu-like symptoms, shortness of breath, cough etc.) before then, please call [phone].  If you test positive for Covid 19 in the 2 weeks post procedure, please call and report this information to [name].    If any biopsies were taken you will be contacted by phone or by letter within the next 1-3 weeks.  Please call us at [phone] if you have not heard about the biopsies in 3 weeks.    SIGNATURES/CONFIDENTIALITY: You and/or your care partner have signed paperwork which will be entered into your electronic medical record.  These signatures attest to the fact that that the information above on your After Visit Summary has been reviewed and is understood.  Full responsibility of the confidentiality of this discharge information lies with you and/or your care-partner.

## 2019-05-05 NOTE — Progress Notes (Signed)
Temp-jb  V/s-cw  Pt's states no medical or surgical changes since previsit or office visit.

## 2019-05-05 NOTE — Progress Notes (Signed)
Report to PACU, RN, vss, BBS= Clear.  

## 2019-05-05 NOTE — Op Note (Signed)
[facility] Patient Name: Sandy Moore Procedure Date: 05/05/2019 8:05 AM MRN: [id number] Endoscopist: Milus Banister , MD Age: 63 Referring MD:  Date of Birth: 03-14-1956 Gender: Female Account #: [id number] Procedure:                Colonoscopy Indications:              High risk colon cancer surveillance: Personal                            history of colonic polyps; Colonoscopy 2017 two TAs                            removed (1.7cm and 2.8cm, pedunculated, sigmoid,                            sites labled); Medicines:                Monitored Anesthesia Care Procedure:                Pre-Anesthesia Assessment:                           - Prior to the procedure, a History and Physical                            was performed, and patient medications and                            allergies were reviewed. The patient's tolerance of                            previous anesthesia was also reviewed. The risks                            and benefits of the procedure and the sedation                            options and risks were discussed with the patient.                            All questions were answered, and informed consent                            was obtained. Prior Anticoagulants: The patient has                            taken no previous anticoagulant or antiplatelet                            agents. ASA Grade Assessment: II - A patient with                            mild systemic disease. After reviewing the risks  and benefits, the patient was deemed in                            satisfactory condition to undergo the procedure.                           After obtaining informed consent, the colonoscope                            was passed under direct vision. Throughout the                            procedure, the patient's blood pressure, pulse, and                            oxygen saturations were monitored  continuously. The                            Colonoscope was introduced through the anus and                            advanced to the the cecum, identified by                            appendiceal orifice and ileocecal valve. The                            colonoscopy was performed without difficulty. The                            patient tolerated the procedure well. The quality                            of the bowel preparation was good. The ileocecal                            valve, appendiceal orifice, and rectum were                            photographed. Scope In: 8:08:45 AM Scope Out: 8:22:45 AM Scope Withdrawal Time: 0 hours 11 minutes 17 seconds  Total Procedure Duration: 0 hours 14 minutes 0 seconds  Findings:                 Multiple small and large-mouthed diverticula were                            found in the left colon.                           The sites of 2017 sigmoid colon resections were                            both clearly located with aid of previous Spot  injections. There was no recurrent polyp at either                            site.                           Internal and external hemorrhoids.                           The exam was otherwise without abnormality on                            direct and retroflexion views. Complications:            No immediate complications. Estimated blood loss:                            None. Estimated Blood Loss:     Estimated blood loss: none. Impression:               - Diverticulosis in the left colon.                           - The sites of 2017 sigmoid colon resections were                            both clearly located with aid of previous Spot                            injections. There was no recurrent polyp at either                            site.                           - Internal and external hemorrhoids.                           - The examination was otherwise  normal on direct                            and retroflexion views.                           - No polyps or cancers. Recommendation:           - Patient has a contact number available for                            emergencies. The signs and symptoms of potential                            delayed complications were discussed with the                            patient. Return to normal activities tomorrow.  Written discharge instructions were provided to the                            patient.                           - Resume previous diet.                           - Continue present medications.                           - Repeat colonoscopy in 5 years for surveillance. Milus Banister, MD 05/05/2019 8:27:37 AM This report has been signed electronically.

## 2019-05-07 ENCOUNTER — Telehealth: Payer: Self-pay

## 2019-05-07 NOTE — Telephone Encounter (Signed)
Left message on answering machine. 

## 2019-12-17 ENCOUNTER — Emergency Department (INDEPENDENT_AMBULATORY_CARE_PROVIDER_SITE_OTHER)
Admission: EM | Admit: 2019-12-17 | Discharge: 2019-12-17 | Disposition: A | Discharge status: Home / Self Care | Payer: BC Managed Care – PPO | Source: Home / Self Care | Attending: Family Medicine | Admitting: Family Medicine

## 2019-12-17 ENCOUNTER — Encounter: Payer: Self-pay | Admitting: Emergency Medicine

## 2019-12-17 ENCOUNTER — Other Ambulatory Visit: Payer: Self-pay

## 2019-12-17 DIAGNOSIS — T63441A Toxic effect of venom of bees, accidental (unintentional), initial encounter: Secondary | ICD-10-CM | POA: Diagnosis not present

## 2019-12-17 MED ORDER — DIPHENHYDRAMINE HCL 50 MG/ML IJ SOLN: 50.0000 mg | Freq: Four times a day (QID) | INTRAMUSCULAR | Status: DC | PRN

## 2019-12-17 MED ORDER — EPINEPHRINE 0.3 MG/0.3ML IJ SOAJ: 0.3000 mg | INTRAMUSCULAR | 0 refills | Status: AC | PRN

## 2019-12-17 MED ORDER — DIPHENHYDRAMINE HCL 50 MG PO CAPS: 50.0000 mg | ORAL_CAPSULE | Freq: Four times a day (QID) | ORAL | Status: DC | PRN

## 2019-12-17 MED ORDER — EPINEPHRINE 0.3 MG/0.3ML IJ SOAJ
0.3000 mg | Freq: Once | INTRAMUSCULAR | Status: AC
  Administered 2019-12-17: 0.3 mg via INTRAMUSCULAR

## 2019-12-17 NOTE — ED Provider Notes (Signed)
[facility]    CSN: [id number] Arrival date & time: 12/17/19  1936      History   Chief Complaint Chief Complaint  Patient presents with  . Insect Bite    HPI Sandy Moore is a 64 y.o. female.   Patient reports that she was stung on her right 4th finger about 15 minutes ago by a bee.  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  She presently does not have an Epi-pen.  She denies swelling in her mouth/throat, shortness of breath, wheezing, hives, etc.     Past Medical History:  Diagnosis Date  . Allergy   . Anemia   . Blood transfusion without reported diagnosis   . End stage renal disease ([place])   . GERD (gastroesophageal reflux disease)   . HTN (hypertension)   . Hyperlipidemia   . Hypothyroidism   . Osteopenia   . Prolapsed internal hemorrhoids, grade 2 12/08/2016  . Status post dilation of esophageal narrowing     Patient Active Problem List   Diagnosis Date Noted  . Prolapsed internal hemorrhoids, grade 2 12/08/2016  . 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 [place]  . COLONOSCOPY    . EXPLORATORY LAPAROTOMY  1983  . HEMORRHOID BANDING    . hysterectomy    . KIDNEY TRANSPLANT  2013  . Left ankle fracture repair    . POLYPECTOMY    . UPPER GASTROINTESTINAL ENDOSCOPY      OB History   No obstetric history on file.      Home Medications    Prior to Admission medications   Medication Sig Start Date End Date Taking? Authorizing Provider  atorvastatin (LIPITOR) 10 MG tablet Take 10 mg by mouth daily.    [provider]  cyanocobalamin 1000 MCG tablet Take 1,000 mcg by mouth every other day.    [provider]  cycloSPORINE modified (NEORAL) 25 MG capsule Take 150 mg by mouth 2 (two) times daily.    [provider]  EPINEPHrine 0.3 mg/0.3 mL IJ SOAJ injection Inject 0.3 mLs (0.3  mg total) into the muscle as needed for anaphylaxis. 12/17/19   Kandra Nicolas, MD  fenoprofen (NALFON) 600 MG TABS tablet Take 600 mg by mouth.    [provider]  fluocinonide cream (LIDEX) 5.88 % Apply 1 application topically 2 (two) times daily.    [provider]  folic acid (FOLVITE) 1 MG tablet Take 1 mg by mouth daily.    [provider]  furosemide (LASIX) 40 MG tablet Take 40 mg by mouth as needed.    [provider]  levothyroxine (SYNTHROID, LEVOTHROID) 112 MCG tablet Take 112 mcg by mouth daily before breakfast.    [provider]  losartan (COZAAR) 50 MG tablet  10/05/16   [provider]  Magnesium Oxide (MAG-OXIDE PO) Take 266 mg by mouth 2 (two) times daily.    [provider]  multivitamin-lutein (OCUVITE-LUTEIN) CAPS Take 1 capsule by mouth daily.    [provider]  mycophenolate (MYFORTIC) 360 MG TBEC EC tablet mycophenolate sodium 360 mg tablet,delayed release 06/15/15   [provider]  omeprazole (PRILOSEC) 20 MG capsule Take 20 mg by mouth 2 (two) times daily before a meal.    [provider]  [place] CYA Target    [provider]  predniSONE (DELTASONE) 5 MG tablet Take 5 mg by mouth daily with breakfast.    [provider]  PREMARIN 1.25 MG tablet Take 1.25 mg by mouth Daily. 04/24/11   [provider]  SAXENDA 18 MG/3ML SOPN daily. 12/25/16   [provider]  sodium bicarbonate 650 MG tablet Take 1,300 mg by mouth 3 (three) times daily.     [provider]  sulfamethoxazole-trimethoprim (BACTRIM,SEPTRA) 400-80 MG tablet Take 1 tablet by mouth 3 (three) times a week.    [provider]  triamcinolone cream (KENALOG) 0.1 % Apply 1 application topically 2 (two) times daily.    [provider]    Family History Family History  Problem Relation Age of Onset  . Crohn's disease Son   . Heart disease Father    . Lung cancer Father        smoker  . Heart disease Mother   . Heart disease Paternal Grandfather        both sets of grandparents  . Kidney disease Maternal Grandmother   . Colon cancer Neg Hx   . Esophageal cancer Neg Hx   . Pancreatic cancer Neg Hx   . Rectal cancer Neg Hx   . Stomach cancer Neg Hx   . Colon polyps Neg Hx     Social History Social History   Tobacco Use  . Smoking status: Never Smoker  . Smokeless tobacco: Never Used  Vaping Use  . Vaping Use: Never used  Substance Use Topics  . Alcohol use: No    Alcohol/week: 0.0 standard drinks  . Drug use: No     Allergies   Bee venom, Diovan [valsartan], Privigen [immune globulin (human)], and Erythromycin   Review of Systems Review of Systems  Constitutional: Negative for activity change, chills, diaphoresis, fatigue and fever.  HENT: Negative for congestion, facial swelling, sore throat and trouble swallowing.   Eyes: Negative.   Respiratory: Negative for cough, choking, chest tightness, shortness of breath, wheezing and stridor.   Cardiovascular: Negative for chest pain and palpitations.  Gastrointestinal: Negative.   Genitourinary: Negative.   Musculoskeletal: Negative for arthralgias, joint swelling and myalgias.  Skin: Negative for rash.  Neurological: Negative for dizziness, light-headedness, numbness and headaches.     Physical Exam Triage Vital Signs ED Triage Vitals  Enc Vitals Group     BP 12/17/19 1945 122/76     Pulse Rate 12/17/19 1945 73     Resp 12/17/19 1950 18     Temp 12/17/19 1945 98.6 F (37 C)     Temp Source 12/17/19 1945 Oral     SpO2 12/17/19 1945 97 %     Weight 12/17/19 1946 222 lb (100.7 kg)     Height 12/17/19 1946 5\' 2"  (1.575 m)     Head Circumference --      Peak Flow --      Pain Score 12/17/19 1946 1     Pain Loc --      Pain Edu? --      Excl. in [place]? --    No data found.  Updated Vital Signs BP 115/77 (BP Location: Left Arm)   Pulse 71   Temp 98.6 F  (37 C) (Oral)   Resp 16   Ht 5\' 2"  (1.575 m)   Wt 100.7 kg   SpO2 98%   BMI 40.60 kg/m   Visual Acuity Right Eye Distance:   Left Eye Distance:   Bilateral Distance:  Right Eye Near:   Left Eye Near:    Bilateral Near:     Physical Exam Vitals and nursing note reviewed.  Constitutional:      General: She is not in acute distress.    Appearance: She is not ill-appearing, toxic-appearing or diaphoretic.  HENT:     Head: Normocephalic.     Right Ear: External ear normal.     Left Ear: External ear normal.     Nose: Nose normal.     Mouth/Throat:     Pharynx: Oropharynx is clear.  Eyes:     Conjunctiva/sclera: Conjunctivae normal.     Pupils: Pupils are equal, round, and reactive to light.  Cardiovascular:     Rate and Rhythm: Normal rate.     Heart sounds: Normal heart sounds.  Pulmonary:     Breath sounds: Normal breath sounds.  Musculoskeletal:        General: No swelling.       Hands:     Right lower leg: No edema.     Left lower leg: No edema.     Comments: Right 4th finger is erythematous, mildly tender with mild swelling.  Finger has full range of motion all joints.  Lymphadenopathy:     Cervical: No cervical adenopathy.  Skin:    General: Skin is warm and dry.     Findings: Erythema present. No rash.  Neurological:     Mental Status: She is alert.      UC Treatments / Results  Labs (all labs ordered are listed, but only abnormal results are displayed) Labs Reviewed - No data to display  EKG   Radiology No results found.  Procedures Procedures (including critical care time)  Medications Ordered in UC Medications  EPINEPHrine (EPI-PEN) injection 0.3 mg (0.3 mg Intramuscular Given 12/17/19 1953)    Initial Impression / Assessment and Plan / UC Course  I have reviewed the triage vital signs and the nursing notes.  Pertinent labs & imaging results that were available during my care of the patient were reviewed by me and considered in my  medical decision making (see chart for details).    Administered Epi-Pen; Rx written for same. Administered Benadryl 50mg  IM Note that patient presently takes prednione 5mg  daily. No evidence anaphylaxis. If symptoms become significantly worse during the night or over the weekend, proceed to the local emergency room.   Final Clinical Impressions(s) / UC Diagnoses   Final diagnoses:  Bee sting reaction, accidental or unintentional, initial encounter     Discharge Instructions     May continue Benadryl 50mg  by mouth every 6 hours for 3 to 4 doses.    ED Prescriptions    Medication Sig Dispense Auth. Provider   EPINEPHrine 0.3 mg/0.3 mL IJ SOAJ injection Inject 0.3 mLs (0.3 mg total) into the muscle as needed for anaphylaxis. 1 each Kandra Nicolas, MD        Kandra Nicolas, MD 12/20/19 1255

## 2019-12-17 NOTE — ED Triage Notes (Signed)
Bee sting 15 mins ago States she is highly allergic

## 2019-12-17 NOTE — Discharge Instructions (Signed)
May continue Benadryl 50mg  by mouth every 6 hours for 3 to 4 doses.

## 2021-04-20 ENCOUNTER — Encounter: Payer: Self-pay | Admitting: Neurology

## 2021-06-23 ENCOUNTER — Encounter: Payer: Self-pay | Admitting: Hematology & Oncology

## 2021-06-30 NOTE — Progress Notes (Signed)
[facility] Clinic Note - Initial Visit   Date: 07/01/21  Sandy Moore MRN: [id number] DOB: Apr 23, 1956   Dear Dr.Groat:  Thank you for your kind referral of Sandy Moore for consultation of double vision. Although her history is well known to you, please allow [name] to reiterate it for the purpose of our medical record. The patient was accompanied to the clinic by self.   History of Present Illness: Sandy Moore is a 66 y.o. right-handed female with diabetes mellitus (HbA1c 5.9), stage 3 CKD,s/p renal transplant (2013), hypertension, hyperlipidemia, and hypothyroidism  presenting for evaluation of intermittent double vision.   Starting around the fall of 2022, she began noticing double vision which images side-by-side.  It lasts about 10 minutes and then resolves, if she closes her right eye. It is worse in the evening when she is tired. It occurs sporadically for a few days and then again in a few weeks. There is no specific pattern or identifiable triggers. No ptosis, difficulty with swallow/speech, numbness/tingling or facial/limb weakness.   Of note, she was attacked by her dog over the weekend and had to put him down. She required stitches to her hand.  Out-side paper records, electronic medical record, and images have been reviewed where available and summarized as:  HbA1c 01/2021:5.9  Lab Results  Component Value Date   ESRSEDRATE 22 04/09/2007    Past Medical History:  Diagnosis Date   Allergy    Anemia    Blood transfusion without reported diagnosis    End stage renal disease ([place])    GERD (gastroesophageal reflux disease)    HTN (hypertension)    Hyperlipidemia    Hypothyroidism    Osteopenia    Prolapsed internal hemorrhoids, grade 2 12/08/2016   Status post dilation of esophageal narrowing     Past Surgical History:  Procedure Laterality Date   CESAREAN SECTION  1980, 1984   [place]     hysterectomy     KIDNEY TRANSPLANT  2013   Left ankle fracture repair     POLYPECTOMY     UPPER GASTROINTESTINAL ENDOSCOPY       Medications:  Outpatient Encounter Medications as of 07/01/2021  Medication Sig   acetaminophen (TYLENOL) 500 MG tablet Take by mouth.   atorvastatin (LIPITOR) 10 MG tablet Take 10 mg by mouth daily.   cyanocobalamin 1000 MCG tablet Take 1,000 mcg by mouth every other day.   cycloSPORINE modified (NEORAL) 25 MG capsule Take 150 mg by mouth 2 (two) times daily.   EPINEPHrine 0.3 mg/0.3 mL IJ SOAJ injection Inject 0.3 mLs (0.3 mg total) into the muscle as needed for anaphylaxis.   folic acid (FOLVITE) 1 MG tablet Take 1 mg by mouth daily.   furosemide (LASIX) 40 MG tablet Take 40 mg by mouth as needed.   levothyroxine (SYNTHROID, LEVOTHROID) 112 MCG tablet Take 112 mcg by mouth daily before breakfast.   losartan (COZAAR) 50 MG tablet    Magnesium Oxide (MAG-OXIDE PO) Take 266 mg by mouth 2 (two) times daily.   multivitamin-lutein (OCUVITE-LUTEIN) CAPS Take 1 capsule by mouth daily.   mycophenolate (MYFORTIC) 360 MG TBEC EC tablet mycophenolate sodium 360 mg tablet,delayed release   omeprazole (PRILOSEC) 20 MG capsule Take 20 mg by mouth 2 (two) times daily before a meal.   ondansetron (ZOFRAN) 4 MG tablet Take 4 mg by mouth every 8 (eight) hours as needed for  nausea or vomiting.   predniSONE (DELTASONE) 5 MG tablet Take 5 mg by mouth daily with breakfast.   PREMARIN 1.25 MG tablet Take 1.25 mg by mouth Daily.   SAXENDA 18 MG/3ML SOPN daily.   sodium bicarbonate 650 MG tablet Take 1,300 mg by mouth 3 (three) times daily.    sulfamethoxazole-trimethoprim (BACTRIM,SEPTRA) 400-80 MG tablet Take 1 tablet by mouth 3 (three) times a week.   fenoprofen (NALFON) 600 MG TABS tablet Take 600 mg by mouth. (Patient not taking: Reported on 07/01/2021)   fluocinonide cream (LIDEX) 8.58 % Apply 1 application topically 2 (two) times daily. (Patient  not taking: Reported on 07/01/2021)   OVER THE COUNTER MEDICATION CYA Target (Patient not taking: Reported on 07/01/2021)   triamcinolone cream (KENALOG) 0.1 % Apply 1 application topically 2 (two) times daily. (Patient not taking: Reported on 07/01/2021)   No facility-administered encounter medications on file as of 07/01/2021.    Allergies:  Allergies  Allergen Reactions   Bee Venom    Diovan [Valsartan]     Pt said possible the reason she went into kidney failure.   Privigen [Immune Globulin (Human)]     Cause muscle contortions all over my body per Pt.   Erythromycin Rash    Family History: Family History  Problem Relation Age of Onset   Crohn's disease Son    Heart disease Father    Lung cancer Father        smoker   Heart disease Mother    Heart disease Paternal Grandfather        both sets of grandparents   Kidney disease Maternal Grandmother    Colon cancer Neg Hx    Esophageal cancer Neg Hx    Pancreatic cancer Neg Hx    Rectal cancer Neg Hx    Stomach cancer Neg Hx    Colon polyps Neg Hx     Social History: Social History   Tobacco Use   Smoking status: Never   Smokeless tobacco: Never  Vaping Use   Vaping Use: Never used  Substance Use Topics   Alcohol use: No    Alcohol/week: 0.0 standard drinks   Drug use: No   Social History   Social History Narrative   Right Handed    Lives in a two story home     Vital Signs:  BP (!) 151/82 Comment: Hasn't taken BP medication yet   Pulse 68    Ht 5\' 2"  (1.575 m)    Wt 247 lb (112 kg)    SpO2 97%    BMI 45.18 kg/m   Neurological Exam: MENTAL STATUS including orientation to time, place, person, recent and remote memory, attention span and concentration, language, and fund of knowledge is normal.  Speech is not dysarthric.  CRANIAL NERVES: II:  No visual field defects.     III-IV-VI: Pupils equal round and reactive to light.  Normal conjugate, extra-ocular eye movements in all directions of gaze.  No  nystagmus.  No ptosis at rest or with sustained upgaze.   V:  Normal facial sensation.    VII:  Normal facial symmetry and movements.   VIII:  Normal hearing and vestibular function.   IX-X:  Normal palatal movement.   XI:  Normal shoulder shrug and head rotation.   XII:  Normal tongue strength and range of motion, no deviation or fasciculation.  MOTOR:  Motor strength is 5/5 throughout, including neck flexion. No atrophy, fasciculations or abnormal movements.  No pronator drift.  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 Plantars downgoing  SENSORY:  Normal and symmetric perception of light touch, pinprick, vibration, and proprioception.   COORDINATION/GAIT: Normal finger-to- nose-finger.  Intact rapid alternating movements bilaterally.  Able to rise from a chair without using arms.  Gait mildly wide-based due to body habitus, and stable, unassisted.  IMPRESSION: Monocular diplopia, intermittent.  Symptoms started in fall 2022.  No associated ptosis, bulbar or limb weakness. Additional testing for myasthenia gravis and brainstem stroke will be initiated.  Patient reassured that my overall suspicion for the latter is low as I would not expect fluctuating symptoms.   PLAN/RECOMMENDATIONS:  MRI brain wo contrast Check AChR antibodies  Further recommendations pending results.   Thank you for allowing me to participate in patient's care.  If I can answer any additional questions, I would be pleased to do so.    Sincerely,    Breia Ocampo K. Posey Pronto, DO

## 2021-07-01 ENCOUNTER — Other Ambulatory Visit: Payer: Self-pay

## 2021-07-01 ENCOUNTER — Ambulatory Visit (INDEPENDENT_AMBULATORY_CARE_PROVIDER_SITE_OTHER): Payer: Medicare Other | Admitting: Neurology

## 2021-07-01 ENCOUNTER — Encounter: Payer: Self-pay | Admitting: Neurology

## 2021-07-01 ENCOUNTER — Encounter: Payer: Self-pay | Admitting: Hematology & Oncology

## 2021-07-01 ENCOUNTER — Other Ambulatory Visit (INDEPENDENT_AMBULATORY_CARE_PROVIDER_SITE_OTHER): Payer: Medicare Other

## 2021-07-01 VITALS — BP 151/82 | HR 68 | Ht 62.0 in | Wt 247.0 lb

## 2021-07-01 DIAGNOSIS — H532 Diplopia: Secondary | ICD-10-CM | POA: Diagnosis not present

## 2021-07-01 NOTE — Patient Instructions (Addendum)
It was good to see you today.   We will check MRI brian without contrast and labs  We will call you with the results.

## 2021-07-06 ENCOUNTER — Ambulatory Visit
Admission: RE | Admit: 2021-07-06 | Discharge: 2021-07-06 | Disposition: A | Discharge status: Home / Self Care | Payer: Medicare Other | Source: Ambulatory Visit | Attending: Neurology | Admitting: Neurology

## 2021-07-06 DIAGNOSIS — H532 Diplopia: Secondary | ICD-10-CM

## 2021-07-07 LAB — MYASTHENIA GRAVIS PANEL 1
A CHR BINDING ABS: <0.3 nmol/L
STRIATED MUSCLE AB SCREEN: NEGATIVE

## 2021-07-08 ENCOUNTER — Telehealth: Payer: Self-pay

## 2021-07-08 DIAGNOSIS — R42 Dizziness and giddiness: Secondary | ICD-10-CM

## 2021-07-08 NOTE — Addendum Note (Signed)
Addended by: Armen Pickup A on: 07/08/2021 02:28 PM   Modules accepted: Orders

## 2021-07-08 NOTE — Telephone Encounter (Signed)
-----   Message from Alda Berthold, DO sent at 07/08/2021 12:26 PM EST ----- MRI brain also resulted. Please let her know that labs and MRI brain does not show any abnormalities to explain double vision - specifically, no myasthenia or stroke. MRI brain does show age-related changes (white matter changes).  If she has additional questions, ok to schedule follow-up visit. Otherwise, she may can follow-up with her eye doctor to be evaluated for prisms.

## 2021-07-08 NOTE — Telephone Encounter (Signed)
Lab and MRI results provided to patient. Patient advised If she has additional questions, ok to schedule follow-up visit. Otherwise, she may can follow-up with her eye doctor.   Patient has requested that we fax MRI and Labs to her eye doctor Dr. Katy Fitch.   MRI and Labs have been faxed to Dr. Katy Fitch.

## 2021-08-19 ENCOUNTER — Ambulatory Visit (INDEPENDENT_AMBULATORY_CARE_PROVIDER_SITE_OTHER): Payer: Medicare Other | Admitting: Internal Medicine

## 2021-08-19 ENCOUNTER — Other Ambulatory Visit: Payer: Self-pay

## 2021-08-19 ENCOUNTER — Encounter: Payer: Self-pay | Admitting: Internal Medicine

## 2021-08-19 VITALS — BP 132/60 | HR 72 | Ht 64.0 in | Wt 243.0 lb

## 2021-08-19 DIAGNOSIS — R0600 Dyspnea, unspecified: Secondary | ICD-10-CM | POA: Diagnosis not present

## 2021-08-19 NOTE — Progress Notes (Signed)
Cardiology Office Note:    Date:  08/19/2021   ID:  Sandy Moore, DOB 29-Jul-1955, MRN [id number]  PCP:  Sharon Seller, MD   [facility] Providers Cardiologist:  Janina Mayo, MD     Referring MD: Sharon Seller, MD   No chief complaint on file. SOB  History of Present Illness:    Sandy Moore is a 66 y.o. female with a hx of renal tx 2013 stage 3 CKD, well controlled DM2,  hypothyroid dx, htn, SCC of the right lateral thigh,  referral from [place] for shortness of breath  She had noted sinus congestion in the office 08/04/2021. She also reported shortness of breath. Also noted tachycardia. Previously worked up by a [facility] in [place] last visit was 2017.  She notes getting jaw pain BL and Sob. She had to stop and pull her bra off while driving on day because of acute onset of SOB. She gets dizzy sometimes. She has a lot of stress. This has been going on for 6 months to a year. She is a retired Marine scientist. She takes deep breaths and closes her eyes.Uses a cold cloth.  It helps resolve it.  It lasts~ 30 minutes. It can happen with watching TV suddently. No chest pressure.  Her husband has guillain barre and she has been busy with him. [facility] She does water aerobics and she has not had symptoms with this.   Cardiology Studies Per report-TTE 2017: normal LV function  Past Medical History:  Diagnosis Date   Allergy    Anemia    Blood transfusion without reported diagnosis    End stage renal disease ([place])    GERD (gastroesophageal reflux disease)    HTN (hypertension)    Hyperlipidemia    Hypothyroidism    Osteopenia    Prolapsed internal hemorrhoids, grade 2 12/08/2016   Status post dilation of esophageal narrowing     Past Surgical History:  Procedure Laterality Date   CESAREAN SECTION  1980, 1984   [place]     hysterectomy     KIDNEY TRANSPLANT  2013    Left ankle fracture repair     POLYPECTOMY     UPPER GASTROINTESTINAL ENDOSCOPY      Current Medications: Current Outpatient Medications on File Prior to Visit  Medication Sig Dispense Refill   acetaminophen (TYLENOL) 500 MG tablet Take by mouth.     atorvastatin (LIPITOR) 10 MG tablet Take 10 mg by mouth daily.     cyanocobalamin 1000 MCG tablet Take 1,000 mcg by mouth every other day.     cycloSPORINE modified (NEORAL) 25 MG capsule Take 150 mg by mouth 2 (two) times daily.     EPINEPHrine 0.3 mg/0.3 mL IJ SOAJ injection Inject 0.3 mLs (0.3 mg total) into the muscle as needed for anaphylaxis. 1 each 0   estradiol (ESTRACE) 2 MG tablet Take 1 tablet by mouth daily.     fenoprofen (NALFON) 600 MG TABS tablet Take 600 mg by mouth.     fluocinonide cream (LIDEX) 1.02 % Apply 1 application. topically 2 (two) times daily.     folic acid (FOLVITE) 1 MG tablet Take 1 mg by mouth daily.     furosemide (LASIX) 40 MG tablet Take 40 mg by mouth as needed.     gabapentin (NEURONTIN) 300 MG capsule Take 300 mg by mouth daily.  levothyroxine (SYNTHROID, LEVOTHROID) 112 MCG tablet Take 112 mcg by mouth daily before breakfast.     losartan (COZAAR) 50 MG tablet      Magnesium Oxide (MAG-OXIDE PO) Take 266 mg by mouth 2 (two) times daily.     multivitamin-lutein (OCUVITE-LUTEIN) CAPS Take 1 capsule by mouth daily.     mycophenolate (MYFORTIC) 360 MG TBEC EC tablet mycophenolate sodium 360 mg tablet,delayed release     omeprazole (PRILOSEC) 20 MG capsule Take 20 mg by mouth 2 (two) times daily before a meal.     ondansetron (ZOFRAN) 4 MG tablet Take 4 mg by mouth every 8 (eight) hours as needed for nausea or vomiting.     OVER THE COUNTER MEDICATION CYA Target     predniSONE (DELTASONE) 5 MG tablet Take 5 mg by mouth daily with breakfast.     SAXENDA 18 MG/3ML SOPN daily.     sodium bicarbonate 650 MG tablet Take 1,300 mg by mouth 3 (three) times daily.      sulfamethoxazole-trimethoprim  (BACTRIM,SEPTRA) 400-80 MG tablet Take 1 tablet by mouth 3 (three) times a week.     triamcinolone cream (KENALOG) 0.1 % Apply 1 application. topically 2 (two) times daily.     PREMARIN 1.25 MG tablet Take 1.25 mg by mouth Daily. (Patient not taking: Reported on 08/19/2021)     No current facility-administered medications on file prior to visit.     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  Tobacco Use   Smoking status: Never   Smokeless tobacco: Never  Vaping Use   Vaping Use: Never used  Substance and Sexual Activity   Alcohol use: No    Alcohol/week: 0.0 standard drinks   Drug use: No   Sexual activity: Not on file  Other Topics Concern   Not on file  Social History Narrative   Right Handed    Lives in a two story home    Social Determinants of Health   Financial Resource Strain: Not on file  Food Insecurity: Not on file  Transportation Needs: Not on file  Physical Activity: Not on file  Stress: Not on file  Social Connections: Not on file     Family History: The patient's family history includes Crohn's disease in her son; Heart disease in her father, mother, and paternal grandfather; Kidney disease in her maternal grandmother; Lung cancer in her father. There is no history of Colon cancer, Esophageal cancer, Pancreatic cancer, Rectal cancer, Stomach cancer, or Colon polyps.  ROS:   Please see the history of present illness.     All other systems reviewed and are negative.  EKGs/Labs/Other Studies Reviewed:    The following studies were reviewed today:   EKG:  EKG is  ordered today.  The ekg ordered today demonstrates   NSR  Recent Labs: No results found  for requested labs within last 8760 hours.  Recent Lipid Panel No results found for: CHOL, TRIG, HDL, CHOLHDL, VLDL, LDLCALC, LDLDIRECT   Risk Assessment/Calculations:           Physical Exam:    VS:    Vitals:   08/19/21 1413  BP: 132/60  Pulse:  72  SpO2: 96%     Wt Readings from Last 3 Encounters:  08/19/21 243 lb (110.2 kg)  07/01/21 247 lb (112 kg)  12/17/19 222 lb (100.7 kg)     GEN:  Well nourished, well developed in no acute distress HEENT: Normal NECK: No JVD; No carotid bruits LYMPHATICS: No lymphadenopathy CARDIAC: RRR, no murmurs, rubs, gallops RESPIRATORY:  Clear to auscultation without rales, wheezing or rhonchi  ABDOMEN: Soft, non-tender, non-distended MUSCULOSKELETAL:  No edema; No deformity  SKIN: Warm and dry NEUROLOGIC:  Alert and oriented x 3 PSYCHIATRIC:  Normal affect   ASSESSMENT:    #Acute SOB/Jaw Pain: We discussed that considering symptoms do not correlate with activity, it is less likely cardiac related. Her EKG is normal. She has no signs of CHF. Her blood pressure is well controlled. LDL goal at least < 100 mg/dL; 70 mg/dL in 2020. We discussed that if her symptoms progress to call EMS/go to the ED.   PLAN:    In order of problems listed above:  Follow up as needed           Medication Adjustments/Labs and Tests Ordered: Current medicines are reviewed at length with the patient today.  Concerns regarding medicines are outlined above.  No orders of the defined types were placed in this encounter.  No orders of the defined types were placed in this encounter.   Patient Instructions  Medication Instructions:  No Changes In Medications at this time.  *If you need a refill on your cardiac medications before your next appointment, please call your pharmacy*  Follow-Up: At [facility], you and your health needs are our priority.  As part of our continuing mission to provide you with exceptional heart care, we have created  designated Provider Care Teams.  These Care Teams include your primary Cardiologist (physician) and Advanced Practice Providers (APPs -  Physician Assistants and Nurse Practitioners) who all work together to provide you with the care you need, when you need it.  We recommend signing up for the patient portal called "MyChart".  Sign up information is provided on this After Visit Summary.  MyChart is used to connect with patients for Virtual Visits (Telemedicine).  Patients are able to view lab/test results, encounter notes, upcoming appointments, etc.  Non-urgent messages can be sent to your provider as well.   To learn more about what you can do with MyChart, go to [email or web address].    Your next appointment:   AS NEEDED   The format for your next appointment:   In Person  Provider:   Janina Mayo, MD      Signed, Janina Mayo, MD  08/19/2021 2:43 PM    [place]

## 2021-08-19 NOTE — Patient Instructions (Signed)
Medication Instructions:  No Changes In Medications at this time.  *If you need a refill on your cardiac medications before your next appointment, please call your pharmacy*  Follow-Up: At [facility], you and your health needs are our priority.  As part of our continuing mission to provide you with exceptional heart care, we have created designated Provider Care Teams.  These Care Teams include your primary Cardiologist (physician) and Advanced Practice Providers (APPs -  Physician Assistants and Nurse Practitioners) who all work together to provide you with the care you need, when you need it.  We recommend signing up for the patient portal called "MyChart".  Sign up information is provided on this After Visit Summary.  MyChart is used to connect with patients for Virtual Visits (Telemedicine).  Patients are able to view lab/test results, encounter notes, upcoming appointments, etc.  Non-urgent messages can be sent to your provider as well.   To learn more about what you can do with MyChart, go to [email or web address].    Your next appointment:   AS NEEDED   The format for your next appointment:   In Person  Provider:   Branch, Mary E, MD   

## 2021-12-05 ENCOUNTER — Ambulatory Visit: Payer: Medicare Other | Admitting: Physician Assistant

## 2022-01-10 LAB — HM DIABETES EYE EXAM

## 2022-08-14 ENCOUNTER — Encounter: Payer: Self-pay | Admitting: Hematology & Oncology

## 2022-08-17 ENCOUNTER — Other Ambulatory Visit (HOSPITAL_COMMUNITY): Payer: Self-pay | Admitting: *Deleted

## 2022-08-21 ENCOUNTER — Ambulatory Visit (HOSPITAL_COMMUNITY)
Admission: RE | Admit: 2022-08-21 | Discharge: 2022-08-21 | Disposition: A | Discharge status: Home / Self Care | Payer: Medicare Other | Source: Ambulatory Visit | Attending: Nephrology | Admitting: Nephrology

## 2022-08-21 ENCOUNTER — Encounter: Payer: Self-pay | Admitting: Hematology & Oncology

## 2022-08-21 DIAGNOSIS — N189 Chronic kidney disease, unspecified: Secondary | ICD-10-CM | POA: Diagnosis present

## 2022-08-21 DIAGNOSIS — D631 Anemia in chronic kidney disease: Secondary | ICD-10-CM | POA: Insufficient documentation

## 2022-08-21 MED ORDER — SODIUM CHLORIDE 0.9 % IV SOLN
510.0000 mg | Freq: Once | INTRAVENOUS | Status: AC
  Administered 2022-08-21: 510 mg via INTRAVENOUS
  Filled 2022-08-21: qty 510

## 2022-08-29 ENCOUNTER — Encounter (HOSPITAL_COMMUNITY): Payer: Medicare Other

## 2023-09-01 LAB — LAB REPORT - SCANNED
Albumin, Urine POC: 117
Albumin/Creatinine Ratio, Urine, POC: 122
Creatinine, POC: 96.1 mg/dL
EGFR: 39

## 2023-09-05 ENCOUNTER — Telehealth: Payer: Self-pay | Admitting: Neurology

## 2023-09-05 ENCOUNTER — Telehealth: Payer: Self-pay | Admitting: Medical

## 2023-09-05 ENCOUNTER — Ambulatory Visit (INDEPENDENT_AMBULATORY_CARE_PROVIDER_SITE_OTHER): Admitting: Medical

## 2023-09-05 VITALS — BP 138/72 | HR 63 | Temp 98.4°F | Resp 20 | Ht 64.0 in | Wt 235.0 lb

## 2023-09-05 DIAGNOSIS — N3000 Acute cystitis without hematuria: Secondary | ICD-10-CM

## 2023-09-05 DIAGNOSIS — D631 Anemia in chronic kidney disease: Secondary | ICD-10-CM | POA: Diagnosis not present

## 2023-09-05 DIAGNOSIS — R5383 Other fatigue: Secondary | ICD-10-CM | POA: Diagnosis not present

## 2023-09-05 DIAGNOSIS — N189 Chronic kidney disease, unspecified: Secondary | ICD-10-CM | POA: Diagnosis not present

## 2023-09-05 DIAGNOSIS — R82998 Other abnormal findings in urine: Secondary | ICD-10-CM

## 2023-09-05 DIAGNOSIS — I480 Paroxysmal atrial fibrillation: Secondary | ICD-10-CM | POA: Diagnosis not present

## 2023-09-05 DIAGNOSIS — D649 Anemia, unspecified: Secondary | ICD-10-CM

## 2023-09-05 DIAGNOSIS — K921 Melena: Secondary | ICD-10-CM

## 2023-09-05 DIAGNOSIS — I10 Essential (primary) hypertension: Secondary | ICD-10-CM | POA: Diagnosis not present

## 2023-09-05 DIAGNOSIS — N390 Urinary tract infection, site not specified: Secondary | ICD-10-CM

## 2023-09-05 DIAGNOSIS — E785 Hyperlipidemia, unspecified: Secondary | ICD-10-CM

## 2023-09-05 LAB — CBC WITH DIFFERENTIAL/PLATELET
Basophils Absolute: 0.1 10*3/uL (ref 0.0–0.1)
Basophils Relative: 0.6 % (ref 0.0–3.0)
Eosinophils Absolute: 0.4 10*3/uL (ref 0.0–0.7)
Eosinophils Relative: 4 % (ref 0.0–5.0)
HCT: 24.8 % — ABNORMAL LOW (ref 36.0–46.0)
Hemoglobin: 7.6 g/dL — CL (ref 12.0–15.0)
Lymphocytes Relative: 15.7 % (ref 12.0–46.0)
Lymphs Abs: 1.6 10*3/uL (ref 0.7–4.0)
MCHC: 30.7 g/dL (ref 30.0–36.0)
MCV: 86.9 fl (ref 78.0–100.0)
Monocytes Absolute: 0.6 10*3/uL (ref 0.1–1.0)
Monocytes Relative: 6.4 % (ref 3.0–12.0)
Neutro Abs: 7.3 10*3/uL (ref 1.4–7.7)
Neutrophils Relative %: 73.3 % (ref 43.0–77.0)
Platelets: 335 10*3/uL (ref 150.0–400.0)
RBC: 2.85 Mil/uL — ABNORMAL LOW (ref 3.87–5.11)
RDW: 18.6 % — ABNORMAL HIGH (ref 11.5–15.5)
WBC: 10 10*3/uL (ref 4.0–10.5)

## 2023-09-05 NOTE — Telephone Encounter (Signed)
 Pt wanting to know if ok with provider TOC from Esperanza Richters to Dr Carmelia Roller, if ok per provider. Pt mentioned it was verbally mentioned to her during her visit today. Please advise.

## 2023-09-05 NOTE — Telephone Encounter (Signed)
 CRITICAL VALUE STICKER  CRITICAL VALUE: HgB 7.6  RECEIVER (on-site recipient of call): Kaytlyn Din  DATE & TIME NOTIFIED: 09/05/2023 12:22 pm  MESSENGER (representative from lab): Clydie Braun  MD NOTIFIED

## 2023-09-05 NOTE — Patient Instructions (Addendum)
 Anemia. Associated with kidney replacement, gi bleed and maybe iron deficency.  Hx of anemia but worse recently acutley with hemoglobin at 8 g/dL, likely multifactorial. Under nephrologist care post-kidney transplant. Recent labs show low RBC and elevated WBC. Normal ferritin. Nephrologist consulted, hemoccult test planned to rule out GI bleeding. - Order repeat CBC, iron panel, and B12 level. - Perform hemoccult test. - Send stool specimen for analysis. - Consider referral to hematologist after reviewing lab results.   Chronic Kidney Disease (Post-Transplant) Kidney transplant recipient with ongoing nephrologist follow-up. Recent labs and kidney function need review to assess stability. Nephrologist adjusted medications for hypertension and kidney protection. - Review recent kidney function tests. - Follow up with nephrologist.  Paroxysmal Atrial Fibrillation Developed post-COVID-19 vaccination. Managed by cardiologist and electrophysiologist. On Eliquis and metoprolol. No recent episodes. Advised against further COVID-19 vaccinations, especially mRNA. - Continue Eliquis 5 mg BID. - Continue metoprolol 25 mg BID. - Follow up with cardiologist and electrophysiologist as needed.  Hypertension Managed with losartan and metoprolol. Dose of losartan reduced due to kidney function concerns. Reports dizziness, possibly medication-related. - Continue losartan 100 mg daily, split into 50 mg BID. - Monitor blood pressure regularly.  Type 2 Diabetes Mellitus Managed with diet and Saxenda. Last HbA1c was 6.1%. Endocrinologist involved, focusing on weight loss for glycemic control. - Continue Saxenda as prescribed. - Follow up with endocrinologist.  Hyperlipidemia Managed with atorvastatin. Cholesterol levels slightly elevated but not significantly concerning. - Continue atorvastatin 40 mg daily.  Gastroesophageal Reflux Disease (GERD) Long-standing GERD managed with omeprazole. Previous  endoscopy showed gastric polyps and diverticula. Overdue for repeat endoscopy and colonoscopy. - Continue omeprazole 20 mg BID. - Schedule colonoscopy and endoscopy.  Dizziness -maybe anemia related. normal neurologic exam. no focal deficits. If dizziness with gross motor or sensory deficts then be seen in ED. Ct imaging not indicated presently.  Follow-up Multiple specialists involved. Coordination of care is essential. Recent labs to be sent to endocrinologist before Friday appointment. - Send lab results to endocrinologist before Friday appointment.(should be available through care everywhere) - Coordinate with nephrologist, cardiologist, and other specialists as needed. - Plan to review lab results and determine need for hematology referral.  Will send lab and note to Dr. Carmelia Roller who is planning to work you in as new pt.

## 2023-09-05 NOTE — Progress Notes (Signed)
 Subjective:    Patient ID: Sandy Moore, female    DOB: 05/10/1956, 68 y.o.   MRN: [id number]  HPI  Pt works as Administrator examiner/NP.  Pt was not able to exercise as she took care of ill husband.    Sandy Moore is a 68 year old female with a history of kidney transplant and paroxysmal atrial fibrillation who presents with fatigue and  intermittet low level constant dizziness. But no ha, motor deficit, ext weakness or sensory deficits.  She has been experiencing significant fatigue and dizziness over the past month, describing a sensation of low energy and near falls, which has made her cautious while driving. Her hemoglobin level has dropped to 8, a level she has not experienced since her time on dialysis(had kidney transplant in the past). Her red blood cell count is 3, and her white blood cell count is elevated. Despite adequate ferritin levels, she has not received a ferritin infusion recently.  She has a history of anemia, previously managed by a hematologist before her kidney transplant. Her anemia was related to her kidney condition, and she has not experienced such low hemoglobin levels since being on dialysis. No report of any black stools.   She developed paroxysmal atrial fibrillation following a COVID vaccine, which led to a cardiac ablation and cardioversion. She is under the care of a cardiologist and an electrophysiologist. Her current medications for atrial fibrillation and hypertension include metoprolol 25 mg twice daily, losartan 100 mg daily (split into two doses), and Eliquis 5 mg twice daily. No recent episodes of atrial fibrillation.  She underwent a kidney transplant in July 2012 and follows up with her transplant team annually. She has been on Eliquis due to PAF since July of the previous year, with a dose adjusted for her age and creatinine levels. She also takes atorvastatin 40 mg for high cholesterol, which is slightly elevated.  She experiences dizziness,  particularly when changing positions or getting up at night, which improves slightly after sitting for a while but does not completely subside. She suspects the dizziness might be related to her medication, particularly losartan, but notes it should have cleared from her system by now.  She is overdue for a colonoscopy and endoscopy, with a history of polyps and diverticula in the sigmoid colon. She recalls a mention of Barrett's esophagitis in the past but no follow-up was done. She has a history of reflux and has been on omeprazole twice daily for a long time.  She manages her diabetes with diet and Saxenda, with a recent A1c of 6.1. She is not currently on any other diabetes medications. She also reports a history of high cholesterol, managed with atorvastatin.      Review of Systems  Constitutional:  Positive for fatigue. Negative for chills and fever.  HENT:  Negative for dental problem and ear pain.   Respiratory:  Negative for cough, chest tightness, wheezing and stridor.   Cardiovascular:  Negative for chest pain and palpitations.  Gastrointestinal:  Negative for abdominal pain, constipation, nausea and vomiting.  Genitourinary:  Negative for dyspareunia, enuresis and frequency.  Musculoskeletal:  Negative for back pain.  Neurological:  Negative for dizziness, seizures, weakness and headaches.       None reported  Hematological:  Negative for adenopathy. Does not bruise/bleed easily.  Psychiatric/Behavioral:  Negative for behavioral problems and confusion.    Past Medical History:  Diagnosis Date   Allergy    Anemia    Blood transfusion  without reported diagnosis    End stage renal disease (HCC)    GERD (gastroesophageal reflux disease)    HTN (hypertension)    Hyperlipidemia    Hypothyroidism    Osteopenia    Prolapsed internal hemorrhoids, grade 2 12/08/2016   Status post dilation of esophageal narrowing      Social History   Socioeconomic History   Marital status:  Married    Spouse name: Not on file   Number of children: 2   Years of education: Not on file   Highest education level: Not on file  Occupational History   Occupation: RN Sports coach  Tobacco Use   Smoking status: Never   Smokeless tobacco: Never  Vaping Use   Vaping status: Never Used  Substance and Sexual Activity   Alcohol use: No    Alcohol/week: 0.0 standard drinks of alcohol   Drug use: No   Sexual activity: Not on file  Other Topics Concern   Not on file  Social History Narrative   Right Handed    Lives in a two story home    Social Drivers of Health   Financial Resource Strain: Not on file  Food Insecurity: Low Risk  (01/03/2023)   Received from [facility]   Hunger Vital Sign    Worried About Running Out of Food in the Last Year: Never true    Ran Out of Food in the Last Year: Never true  Transportation Needs: Not on file (01/03/2023)  Physical Activity: Not on file  Stress: No Stress Concern Present (06/25/2020)   Received from [facility], [facility]   [employer] [facility] - Occupational Stress Questionnaire    Feeling of Stress : Only a little  Social Connections: Unknown (10/22/2021)   Received from [facility]   Social Network    Social Network: Not on file  Intimate Partner Violence: Low Risk  (08/08/2022)   Received from [facility] visits prior to 08/12/2022., [facility] visits prior to 08/12/2022.   Safety    How often does anyone, including family and friends, physically hurt you?: Never    How often does anyone, including family and friends, insult or talk down to you?: Never    How often does anyone, including family and friends, threaten you with harm?: Never    How often does anyone, including family and friends, scream or curse at you?: Never    Past Surgical History:  Procedure Laterality Date   CESAREAN SECTION  1980, 1984   COLONOSCOPY     EXPLORATORY  LAPAROTOMY  1983   HEMORRHOID BANDING     hysterectomy     KIDNEY TRANSPLANT  2013   Left ankle fracture repair     POLYPECTOMY     UPPER GASTROINTESTINAL ENDOSCOPY      Family History  Problem Relation Age of Onset   Crohn's disease Son    Heart disease Father    Lung cancer Father        smoker   Heart disease Mother    Heart disease Paternal Grandfather        both sets of grandparents   Kidney disease Maternal Grandmother    Colon cancer Neg Hx    Esophageal cancer Neg Hx    Pancreatic cancer Neg Hx    Rectal cancer Neg Hx    Stomach cancer Neg Hx    Colon polyps Neg Hx     Allergies  Allergen Reactions   Bee Venom    Diovan [Valsartan]     Pt said possible the reason she went into kidney failure.   Privigen [Immune Globulin (Human)]     Cause muscle contortions all over my body per Pt.   Erythromycin Rash    Current Outpatient Medications on File Prior to Visit  Medication Sig Dispense Refill   acetaminophen (TYLENOL) 500 MG tablet Take by mouth.     atorvastatin (LIPITOR) 10 MG tablet Take 10 mg by mouth daily.     cyanocobalamin 1000 MCG tablet Take 1,000 mcg by mouth every other day.     cycloSPORINE modified (NEORAL) 25 MG capsule Take 150 mg by mouth 2 (two) times daily.     EPINEPHrine 0.3 mg/0.3 mL IJ SOAJ injection Inject 0.3 mLs (0.3 mg total) into the muscle as needed for anaphylaxis. 1 each 0   estradiol (ESTRACE) 2 MG tablet Take 1 tablet by mouth daily.     fenoprofen (NALFON) 600 MG TABS tablet Take 600 mg by mouth.     fluocinonide cream (LIDEX) 0.05 % Apply 1 application. topically 2 (two) times daily.     folic acid (FOLVITE) 1 MG tablet Take 1 mg by mouth daily.     furosemide (LASIX) 40 MG tablet Take 40 mg by mouth as needed.     gabapentin (NEURONTIN) 300 MG capsule Take 300 mg by mouth daily.     levothyroxine (SYNTHROID, LEVOTHROID) 112 MCG tablet Take 112 mcg by mouth daily before breakfast.     losartan (COZAAR) 50 MG tablet       Magnesium Oxide (MAG-OXIDE PO) Take 266 mg by mouth 2 (two) times daily.     multivitamin-lutein (OCUVITE-LUTEIN) CAPS Take 1 capsule by mouth daily.     mycophenolate (MYFORTIC) 360 MG TBEC EC tablet mycophenolate sodium 360 mg tablet,delayed release     omeprazole (PRILOSEC) 20 MG capsule Take 20 mg by mouth 2 (two) times daily before a meal.     ondansetron (ZOFRAN) 4 MG tablet Take 4 mg by mouth every 8 (eight) hours as needed for nausea or vomiting.     OVER THE COUNTER MEDICATION CYA Target     predniSONE (DELTASONE) 5 MG tablet Take 5 mg by mouth daily with breakfast.     PREMARIN 1.25 MG tablet Take 1.25 mg by mouth Daily. (Patient not taking: Reported on 08/19/2021)     SAXENDA 18 MG/3ML SOPN daily.     sodium bicarbonate 650 MG tablet Take 1,300 mg by mouth 3 (three) times daily.      sulfamethoxazole-trimethoprim (BACTRIM,SEPTRA) 400-80 MG tablet Take 1 tablet by mouth 3 (three) times a week.     triamcinolone cream (KENALOG) 0.1 % Apply 1 application. topically 2 (two) times daily.     No current facility-administered medications on file prior to visit.    BP 138/72   Pulse 63   Temp 98.4 F (36.9 C)   Resp 20   Ht 5\' 4"  (1.626 m)   Wt 235 lb (106.6 kg)   SpO2 100%   BMI 40.34 kg/m        Objective:   Physical Exam   General Mental Status- Alert. General Appearance- Not in acute distress.   Skin General: Color- Normal Color. Moisture- Normal Moisture.  Neck  No JVD.  Chest and Lung Exam Auscultation: Breath Sounds:-CTA  Cardiovascular Auscultation:Rythm- RRR Murmurs & Other Heart Sounds:Auscultation of the heart reveals- No Murmurs.  Abdomen Inspection:-Inspeection Normal. Palpation/Percussion:Note:No mass. Palpation  and Percussion of the abdomen reveal- Non Tender, Non Distended + BS, no rebound or guarding.   Neurologic Cranial Nerve exam:- CN III-XII intact(No nystagmus), symmetric smile. Strength:- 5/5 equal and symmetric strength both upper and  lower extremities.    Lower ext- no pedal edema, calf symmetric.    Assessment & Plan:   Patient Instructions  Anemia. Associated with kidney replacement, gi bleed and maybe iron deficency.  Hx of anemia but worse recently acutley with hemoglobin at 8 g/dL, likely multifactorial. Under nephrologist care post-kidney transplant. Recent labs show low RBC and elevated WBC. Normal ferritin. Nephrologist consulted, hemoccult test planned to rule out GI bleeding. - Order repeat CBC, iron panel, and B12 level. - Perform hemoccult test. - Send stool specimen for analysis. - Consider referral to hematologist after reviewing lab results.   Chronic Kidney Disease (Post-Transplant) Kidney transplant recipient with ongoing nephrologist follow-up. Recent labs and kidney function need review to assess stability. Nephrologist adjusted medications for hypertension and kidney protection. - Review recent kidney function tests. - Follow up with nephrologist.  Paroxysmal Atrial Fibrillation Developed post-COVID-19 vaccination. Managed by cardiologist and electrophysiologist. On Eliquis and metoprolol. No recent episodes. Advised against further COVID-19 vaccinations, especially mRNA. - Continue Eliquis 5 mg BID. - Continue metoprolol 25 mg BID. - Follow up with cardiologist and electrophysiologist as needed.  Hypertension Managed with losartan and metoprolol. Dose of losartan reduced due to kidney function concerns. Reports dizziness, possibly medication-related. - Continue losartan 100 mg daily, split into 50 mg BID. - Monitor blood pressure regularly.  Type 2 Diabetes Mellitus Managed with diet and Saxenda. Last HbA1c was 6.1%. Endocrinologist involved, focusing on weight loss for glycemic control. - Continue Saxenda as prescribed. - Follow up with endocrinologist.  Hyperlipidemia Managed with atorvastatin. Cholesterol levels slightly elevated but not significantly concerning. - Continue  atorvastatin 40 mg daily.  Gastroesophageal Reflux Disease (GERD) Long-standing GERD managed with omeprazole. Previous endoscopy showed gastric polyps and diverticula. Overdue for repeat endoscopy and colonoscopy. - Continue omeprazole 20 mg BID. - Schedule colonoscopy and endoscopy.  Dizziness -maybe anemia related. normal neurologic exam. no focal deficits. If dizziness with gross motor or sensory deficts then be seen in ED. Ct imaging not indicated presently.  Follow-up Multiple specialists involved. Coordination of care is essential. Recent labs to be sent to endocrinologist before Friday appointment. - Send lab results to endocrinologist before Friday appointment.(should be available through care everywhere) - Coordinate with nephrologist, cardiologist, and other specialists as needed. - Plan to review lab results and determine need for hematology referral.  Will send lab and note to Dr. Carmelia Roller who is planning to work you in as new pt.   Esperanza Richters, PA-C    Time spent with patient today was 45+  minutes which consisted of chart review, discussing various diagnosis with new pt, work up, treatment and documentation.

## 2023-09-05 NOTE — Telephone Encounter (Signed)
 That's fine. I saw her husband.

## 2023-09-06 ENCOUNTER — Telehealth: Payer: Self-pay | Admitting: *Deleted

## 2023-09-06 ENCOUNTER — Ambulatory Visit: Payer: Self-pay | Admitting: Medical

## 2023-09-06 LAB — TSH: TSH: 1.01 u[IU]/mL (ref 0.35–5.50)

## 2023-09-06 LAB — VITAMIN B12: Vitamin B-12: 728 pg/mL (ref 211–911)

## 2023-09-06 LAB — T4, FREE: Free T4: 1.14 ng/dL (ref 0.60–1.60)

## 2023-09-06 MED ORDER — IRON (FERROUS SULFATE) 325 (65 FE) MG PO TABS: 325.0000 mg | ORAL_TABLET | Freq: Every day | ORAL | 0 refills | Status: AC

## 2023-09-06 NOTE — Telephone Encounter (Signed)
 Unable to reach patient after 3 attempts by E2C2 NT, routing to the provider for resolution per protocol.

## 2023-09-06 NOTE — Telephone Encounter (Signed)
 Your iron level is low and ferritin lower end normal. Ifob test pending. Updated hematologist on iron panel results. Hoping to get you in by tomorrow. If they don't see you by tomorrow then want you to go ahead and start iron tab 1 tab twice daily. On review med list did not see iron on list.

## 2023-09-06 NOTE — Addendum Note (Signed)
 Addended by: Gwenevere Abbot on: 09/06/2023 06:52 AM   Modules accepted: Orders

## 2023-09-06 NOTE — Telephone Encounter (Signed)
 Copied from CRM [id number]. Topic: Clinical - Red Word Triage >> Sep 06, 2023 11:06 AM Deaijah H wrote: Red Word that prompted transfer to Nurse Triage: Feels awful cannot walk ; coughed up blood last night, dizzy, falling, headache, no appetite. Would like someone to reach out to hematologist to be seen as soon as possible. Would like someone to reach [facility] [phone]

## 2023-09-06 NOTE — Telephone Encounter (Signed)
 Patient called stating that she is a former patient of ours and hasn't been seen for 12 years.  She has had a kidney transplant in the meantime.  Was seen by Ed Saguier yesterday where here Hgb was 7.6.  A referral was sent yesterday.  Patient is now symptomatic, dizzy and coughing up blood wanting to get an appt here asap.  Talked with Dr Myna Hidalgo.  Unfortunately, we dont have new patient appointments today or tomorrow.  Recommendation is to go to the ED to be evaluated in light of coughing up blood and symptoms.  Patient reluctant but has appointment tomorrow with endocrinology.  Stat request sent to scheduling for new patient appointment

## 2023-09-06 NOTE — Telephone Encounter (Signed)
 Patient called, left VM to return the call to the office to speak to NT.

## 2023-09-07 LAB — FECAL OCCULT BLOOD, IMMUNOCHEMICAL: Fecal Occult Bld: POSITIVE — AB

## 2023-09-07 NOTE — Addendum Note (Signed)
 Addended by: Gwenevere Abbot on: 09/07/2023 11:52 AM   Modules accepted: Orders

## 2023-09-08 LAB — URINE CULTURE
MICRO NUMBER:: 16250363
SPECIMEN QUALITY:: ADEQUATE

## 2023-09-08 LAB — IRON,TIBC AND FERRITIN PANEL
%SAT: 6 % — ABNORMAL LOW (ref 16–45)
Ferritin: 20 ng/mL (ref 16–288)
Iron: 21 ug/dL — ABNORMAL LOW (ref 45–160)
TIBC: 352 ug/dL (ref 250–450)

## 2023-09-09 NOTE — Telephone Encounter (Signed)
 Can you go ahead and get pt appointment with Dr. Carmelia Roller week of April 10 th or late this coming week. Pt is new with  numerous moderate to severe  medical problems so best to go ahead and get establlshed with pcp. Coordinate with receptionist and MA working with him to get her in.

## 2023-09-10 ENCOUNTER — Other Ambulatory Visit: Payer: Self-pay | Admitting: Medical

## 2023-09-10 ENCOUNTER — Other Ambulatory Visit: Payer: Self-pay | Admitting: Family

## 2023-09-10 ENCOUNTER — Encounter: Payer: Self-pay | Admitting: *Deleted

## 2023-09-10 DIAGNOSIS — D649 Anemia, unspecified: Secondary | ICD-10-CM

## 2023-09-10 MED ORDER — AMOXICILLIN-POT CLAVULANATE 875-125 MG PO TABS: 1.0000 | ORAL_TABLET | Freq: Two times a day (BID) | ORAL | 0 refills | Status: DC

## 2023-09-10 NOTE — Telephone Encounter (Signed)
 Pt has ecoli and klebsiella on urine culture. Multi drug resist and options are limited. Augmentin may work for both. For klebsiella it is intermediate effective. Our computer bring up warning on mycophenolate that concentrations effected. Pt has kidney transplant. So wanted nephrologist opinion on this. Can pt use augmentin. Would mycophenolate need to adjusted/increased while on augmentin.   Also will try to get infectious disease opinion as in the end pt may need iv antibiotics.But wanting her to start treatment pending the ID referral. Will you call nephrologist office Dr. Abel Presto and try ext [phone]. I think Ernesta Amble is his nurse. If they could give me update by 10 am? Fax over cmp and urine c and s results.  Certain antibiotics (eg, Systemic Antibiotics) may decrease serum concentrations of the active metabolite(s) of mycophenolate. Specifically, concentrations of mycophenolic acid (MPA) may be reduced.

## 2023-09-10 NOTE — Telephone Encounter (Signed)
 Pt stated she will come by office tomorrow to schedule an appt

## 2023-09-10 NOTE — Addendum Note (Signed)
 Addended by: Gwenevere Abbot on: 09/10/2023 04:55 PM   Modules accepted: Orders

## 2023-09-10 NOTE — Telephone Encounter (Signed)
 Will you work on getting patient [facility]  See the referral.  Hoping that patient could be seen Wednesday Thursday or Friday this week at the latest.  Would prefer Wednesday if at all possible

## 2023-09-10 NOTE — Addendum Note (Signed)
 Addended by: Gwenevere Abbot on: 09/10/2023 05:14 PM   Modules accepted: Orders

## 2023-09-10 NOTE — Telephone Encounter (Signed)
 Copied from CRM [id number]. Topic: Clinical - Medication Refill >> Sep 10, 2023  4:02 PM Cammy Copa D wrote: Most Recent Primary Care Visit:  Provider: Esperanza Richters  Department: [facility]  Visit Type: NEW PT - OFFICE VISIT  Date: 09/05/2023  Medication:  Augmentin  Has the patient contacted their pharmacy? Yes (Agent: If no, request that the patient contact the pharmacy for the refill. If patient does not wish to contact the pharmacy document the reason why and proceed with request.) (Agent: If yes, when and what did the pharmacy advise?) Pharmacy has not received the order + order isn't on meds list.  Is this the correct pharmacy for this prescription? Yes If no, delete pharmacy and type the correct one.  This is the patient's preferred pharmacy:  [id number] - [place], [place] - [address] AT [facility] [address] [place] [address] Phone: [phone] Fax: [phone]   Has the prescription been filled recently? No  Is the patient out of the medication? Yes  Has the patient been seen for an appointment in the last year OR does the patient have an upcoming appointment? Yes  Can we respond through MyChart? Yes  Agent: Please be advised that Rx refills may take up to 3 business days. We ask that you follow-up with your pharmacy.

## 2023-09-11 ENCOUNTER — Inpatient Hospital Stay

## 2023-09-11 ENCOUNTER — Other Ambulatory Visit: Payer: Self-pay | Admitting: *Deleted

## 2023-09-11 ENCOUNTER — Telehealth: Payer: Self-pay | Admitting: Internal Medicine

## 2023-09-11 ENCOUNTER — Inpatient Hospital Stay (HOSPITAL_BASED_OUTPATIENT_CLINIC_OR_DEPARTMENT_OTHER): Admitting: Family

## 2023-09-11 ENCOUNTER — Inpatient Hospital Stay: Attending: Hematology & Oncology

## 2023-09-11 VITALS — BP 143/78 | HR 70 | Temp 97.8°F | Resp 18

## 2023-09-11 VITALS — BP 138/52 | HR 79 | Temp 98.0°F | Resp 16

## 2023-09-11 DIAGNOSIS — R5383 Other fatigue: Secondary | ICD-10-CM

## 2023-09-11 DIAGNOSIS — R195 Other fecal abnormalities: Secondary | ICD-10-CM | POA: Diagnosis not present

## 2023-09-11 DIAGNOSIS — D649 Anemia, unspecified: Secondary | ICD-10-CM

## 2023-09-11 DIAGNOSIS — Z87440 Personal history of urinary (tract) infections: Secondary | ICD-10-CM | POA: Insufficient documentation

## 2023-09-11 DIAGNOSIS — K922 Gastrointestinal hemorrhage, unspecified: Secondary | ICD-10-CM | POA: Diagnosis not present

## 2023-09-11 DIAGNOSIS — D500 Iron deficiency anemia secondary to blood loss (chronic): Secondary | ICD-10-CM

## 2023-09-11 DIAGNOSIS — R531 Weakness: Secondary | ICD-10-CM | POA: Diagnosis not present

## 2023-09-11 DIAGNOSIS — R1013 Epigastric pain: Secondary | ICD-10-CM

## 2023-09-11 DIAGNOSIS — Z91030 Bee allergy status: Secondary | ICD-10-CM | POA: Insufficient documentation

## 2023-09-11 DIAGNOSIS — N186 End stage renal disease: Secondary | ICD-10-CM

## 2023-09-11 DIAGNOSIS — Z881 Allergy status to other antibiotic agents status: Secondary | ICD-10-CM

## 2023-09-11 DIAGNOSIS — R202 Paresthesia of skin: Secondary | ICD-10-CM

## 2023-09-11 DIAGNOSIS — R6883 Chills (without fever): Secondary | ICD-10-CM

## 2023-09-11 DIAGNOSIS — Z8379 Family history of other diseases of the digestive system: Secondary | ICD-10-CM

## 2023-09-11 DIAGNOSIS — R0602 Shortness of breath: Secondary | ICD-10-CM

## 2023-09-11 DIAGNOSIS — Z841 Family history of disorders of kidney and ureter: Secondary | ICD-10-CM

## 2023-09-11 DIAGNOSIS — R200 Anesthesia of skin: Secondary | ICD-10-CM | POA: Diagnosis not present

## 2023-09-11 DIAGNOSIS — Z79890 Hormone replacement therapy (postmenopausal): Secondary | ICD-10-CM

## 2023-09-11 DIAGNOSIS — Z90710 Acquired absence of both cervix and uterus: Secondary | ICD-10-CM | POA: Insufficient documentation

## 2023-09-11 DIAGNOSIS — E039 Hypothyroidism, unspecified: Secondary | ICD-10-CM

## 2023-09-11 DIAGNOSIS — Z8249 Family history of ischemic heart disease and other diseases of the circulatory system: Secondary | ICD-10-CM | POA: Diagnosis not present

## 2023-09-11 DIAGNOSIS — Z801 Family history of malignant neoplasm of trachea, bronchus and lung: Secondary | ICD-10-CM | POA: Insufficient documentation

## 2023-09-11 DIAGNOSIS — D509 Iron deficiency anemia, unspecified: Secondary | ICD-10-CM | POA: Insufficient documentation

## 2023-09-11 DIAGNOSIS — Z79899 Other long term (current) drug therapy: Secondary | ICD-10-CM | POA: Diagnosis not present

## 2023-09-11 DIAGNOSIS — Z8719 Personal history of other diseases of the digestive system: Secondary | ICD-10-CM | POA: Diagnosis not present

## 2023-09-11 DIAGNOSIS — M7989 Other specified soft tissue disorders: Secondary | ICD-10-CM | POA: Insufficient documentation

## 2023-09-11 DIAGNOSIS — W19XXXA Unspecified fall, initial encounter: Secondary | ICD-10-CM | POA: Diagnosis not present

## 2023-09-11 DIAGNOSIS — D631 Anemia in chronic kidney disease: Secondary | ICD-10-CM | POA: Insufficient documentation

## 2023-09-11 DIAGNOSIS — R1906 Epigastric swelling, mass or lump: Secondary | ICD-10-CM

## 2023-09-11 LAB — CBC WITH DIFFERENTIAL (CANCER CENTER ONLY)
Abs Immature Granulocytes: 0.04 10*3/uL (ref 0.00–0.07)
Basophils Absolute: 0 10*3/uL (ref 0.0–0.1)
Basophils Relative: 0 %
Eosinophils Absolute: 0.4 10*3/uL (ref 0.0–0.5)
Eosinophils Relative: 4 %
HCT: 23.9 % — ABNORMAL LOW (ref 36.0–46.0)
Hemoglobin: 6.9 g/dL — CL (ref 12.0–15.0)
Immature Granulocytes: 0 %
Lymphocytes Relative: 28 %
Lymphs Abs: 2.9 10*3/uL (ref 0.7–4.0)
MCH: 26.4 pg (ref 26.0–34.0)
MCHC: 28.9 g/dL — ABNORMAL LOW (ref 30.0–36.0)
MCV: 91.6 fL (ref 80.0–100.0)
Monocytes Absolute: 0.9 10*3/uL (ref 0.1–1.0)
Monocytes Relative: 9 %
Neutro Abs: 5.9 10*3/uL (ref 1.7–7.7)
Neutrophils Relative %: 59 %
Platelet Count: 304 10*3/uL (ref 150–400)
RBC: 2.61 MIL/uL — ABNORMAL LOW (ref 3.87–5.11)
RDW: 18.7 % — ABNORMAL HIGH (ref 11.5–15.5)
WBC Count: 10.1 10*3/uL (ref 4.0–10.5)
nRBC: 0 % (ref 0.0–0.2)

## 2023-09-11 LAB — IRON AND IRON BINDING CAPACITY (CC-WL,HP ONLY)
Iron: 32 ug/dL (ref 28–170)
Saturation Ratios: 9 % — ABNORMAL LOW (ref 10.4–31.8)
TIBC: 360 ug/dL (ref 250–450)
UIBC: 328 ug/dL (ref 148–442)

## 2023-09-11 LAB — RETICULOCYTES
Immature Retic Fract: 41.7 % — ABNORMAL HIGH (ref 2.3–15.9)
RBC.: 2.59 MIL/uL — ABNORMAL LOW (ref 3.87–5.11)
Retic Count, Absolute: 138.3 10*3/uL (ref 19.0–186.0)
Retic Ct Pct: 5.3 % — ABNORMAL HIGH (ref 0.4–3.1)

## 2023-09-11 LAB — CMP (CANCER CENTER ONLY)
ALT: 10 U/L (ref 0–44)
AST: 10 U/L — ABNORMAL LOW (ref 15–41)
Albumin: 3.8 g/dL (ref 3.5–5.0)
Alkaline Phosphatase: 121 U/L (ref 38–126)
Anion gap: 11 (ref 5–15)
BUN: 30 mg/dL — ABNORMAL HIGH (ref 8–23)
CO2: 24 mmol/L (ref 22–32)
Calcium: 9 mg/dL (ref 8.9–10.3)
Chloride: 107 mmol/L (ref 98–111)
Creatinine: 1.72 mg/dL — ABNORMAL HIGH (ref 0.44–1.00)
GFR, Estimated: 32 mL/min — ABNORMAL LOW (ref >60)
Glucose, Bld: 105 mg/dL — ABNORMAL HIGH (ref 70–99)
Potassium: 4.4 mmol/L (ref 3.5–5.1)
Sodium: 142 mmol/L (ref 135–145)
Total Bilirubin: 0.8 mg/dL (ref 0.0–1.2)
Total Protein: 6.3 g/dL — ABNORMAL LOW (ref 6.5–8.1)

## 2023-09-11 LAB — PREPARE RBC (CROSSMATCH)

## 2023-09-11 LAB — SAMPLE TO BLOOD BANK

## 2023-09-11 LAB — ABO/RH: ABO/RH(D): O NEG

## 2023-09-11 LAB — LACTATE DEHYDROGENASE: LDH: 177 U/L (ref 98–192)

## 2023-09-11 LAB — FERRITIN: Ferritin: 52 ng/mL (ref 11–307)

## 2023-09-11 MED ORDER — ACETAMINOPHEN 325 MG PO TABS
650.0000 mg | ORAL_TABLET | Freq: Once | ORAL | Status: DC
Start: 2023-09-11 — End: 2023-09-11

## 2023-09-11 MED ORDER — SODIUM CHLORIDE 0.9% IV SOLUTION: 250.0000 mL | INTRAVENOUS | Status: DC

## 2023-09-11 MED ORDER — SODIUM CHLORIDE 0.9 % IV SOLN: INTRAVENOUS | Status: DC

## 2023-09-11 MED ORDER — FUROSEMIDE 10 MG/ML IJ SOLN: 20.0000 mg | Freq: Once | INTRAMUSCULAR | Status: DC

## 2023-09-11 MED ORDER — SODIUM CHLORIDE 0.9 % IV SOLN
1000.0000 mg | Freq: Once | INTRAVENOUS | Status: AC
  Administered 2023-09-11: 1000 mg via INTRAVENOUS
  Filled 2023-09-11: qty 10

## 2023-09-11 MED ORDER — DIPHENHYDRAMINE HCL 25 MG PO CAPS
25.0000 mg | ORAL_CAPSULE | Freq: Once | ORAL | Status: AC
  Administered 2023-09-11: 25 mg via ORAL
  Filled 2023-09-11: qty 1

## 2023-09-11 NOTE — Telephone Encounter (Signed)
 Left detailed message for assistant Sandy Moore to call back.

## 2023-09-11 NOTE — Telephone Encounter (Signed)
 Pt scheduled to see Bayley McMichael PA 09/17/23@9 :30am. Please notify pt of appt date and time.

## 2023-09-11 NOTE — Patient Instructions (Signed)
 Blood Transfusion, Adult A blood transfusion is a procedure in which you receive blood or a type of blood cell (blood component) through an IV. You may need a blood transfusion when you have a low blood count, which is a low number of any blood cell. This may result from a bleeding disorder, illness, injury, or surgery. The blood may come from a donor, or you may be able to have your own blood collected and stored (autologous blood donation) before a planned surgery. The blood given in a transfusion may be made up of different blood components. You may receive: Red blood cells. These carry oxygen to the cells in the body. Platelets. These help your blood to clot. Plasma. This is the liquid part of your blood. It carries proteins and other substances throughout the body. White blood cells. These help you fight infections. If you have hemophilia or another clotting disorder, you may also receive other types of blood products. Depending on the type of blood product, this procedure may take 1-4 hours to complete. Tell a health care provider about: Any bleeding problems you have. Any previous reactions you have had during a blood transfusion. Any allergies you have. All medicines you are taking, including vitamins, herbs, eye drops, creams, and over-the-counter medicines. Any surgeries you have had. Any medical conditions you have. Whether you are pregnant or may be pregnant. What are the risks? Talk with your health care provider about risks. The most common problems include: A mild allergic reaction, such as red, swollen areas of skin (hives) and itching. Fever or chills. This may be the body's response to new blood cells received. This may occur during or up to 4 hours after the transfusion. More serious problems may include: A serious allergic reaction that causes difficulty breathing or swelling around the face and lips. Transfusion-associated circulatory overload (TACO), or too much fluid in  the lungs. This may cause breathing problems. Transfusion-related acute lung injury (TRALI), which causes breathing difficulty and low oxygen in the blood. This can occur within hours of the transfusion or several days later. Iron overload. This can happen after receiving many blood transfusions over a period of time. Infection or virus being transmitted. This is rare because donated blood is carefully tested before it is given. Hemolytic transfusion reaction. This is rare. It happens when the body's defense system (immune system)tries to attack the new blood cells. Symptoms may include fever, chills, nausea, low blood pressure, and low back or chest pain. Transfusion-associated graft-versus-host disease (TAGVHD). This is rare. It happens when donated cells attack the body's healthy tissues. What happens before the procedure? You will have a blood test to check your blood type. This test is done to know what kind of blood your body will accept and to match it to the donor blood. If you are going to have a planned surgery, you may be able to do an autologous blood donation. This may be done in case you need to have a transfusion. You will have your temperature, blood pressure, and pulse checked before the transfusion. If you have had an allergic reaction to a transfusion in the past, you may be given medicine to help prevent a reaction. This medicine may be given to you by mouth (orally) or through an IV. What happens during the procedure?  An IV will be inserted into one of your veins. The bag of blood will be attached to your IV. The blood will then enter through your vein. Your temperature, blood pressure,  and pulse will be monitored during the transfusion. This monitoring is done to detect early signs of a transfusion reaction. Tell your nurse right away if you have any of these symptoms during the transfusion: Shortness of breath or trouble breathing. Chest or back pain. Fever or  chills. Itching or hives. If you have any signs or symptoms of a reaction, your transfusion will be stopped and you may be given medicine. When the transfusion is complete, your IV will be removed. Pressure may be applied to the IV site for a few minutes. A bandage (dressing)will be applied. The procedure may vary among health care providers and hospitals. What happens after the procedure? Your temperature, blood pressure, pulse, breathing rate, and blood oxygen level will be monitored until you leave the hospital or clinic. Your blood may be tested to see how you have responded to the transfusion. You may be warmed with fluids or blankets to maintain a normal body temperature. If you receive your blood transfusion in an outpatient setting, you will be told whom to contact to report any reactions. Where to find more information Visit the [employer]: redcross.org Summary A blood transfusion is a procedure in which you receive blood or a type of blood cell (blood component) through an IV. The blood given in a transfusion may be made up of different blood components. You may receive red blood cells, platelets, plasma, or white blood cells depending on the condition treated. Your temperature, blood pressure, and pulse will be monitored before, during, and after the transfusion. After the transfusion, your blood may be tested to see how your body has responded. This information is not intended to replace advice given to you by your health care provider. Make sure you discuss any questions you have with your health care provider. Document Revised: 08/26/2021 Document Reviewed: 08/26/2021 Elsevier Patient Education  2024 Elsevier Inc.Ferric Derisomaltose Injection What is this medication? FERRIC DERISOMALTOSE (FER ik der EYE soe MAWL tose) treats low levels of iron in your body (iron deficiency anemia). Iron is a mineral that plays an important role in making red blood cells, which carry  oxygen from your lungs to the rest of your body. This medicine may be used for other purposes; ask your health care provider or pharmacist if you have questions. COMMON BRAND NAME(S): MONOFERRIC What should I tell my care team before I take this medication? They need to know if you have any of these conditions: High levels of iron in the blood An unusual or allergic reaction to iron, other medications, foods, dyes, or preservatives Pregnant or trying to get pregnant Breastfeeding How should I use this medication? This medication is injected into a vein. It is given by your care team in a hospital or clinic setting. Talk to your care team about the use of this medication in children. Special care may be needed. Overdosage: If you think you have taken too much of this medicine contact a poison control center or emergency room at once. NOTE: This medicine is only for you. Do not share this medicine with others. What if I miss a dose? It is important not to miss your dose. Call your care team if you are unable to keep an appointment. What may interact with this medication? Do not take this medication with any of the following: Deferoxamine Dimercaprol Other iron products This list may not describe all possible interactions. Give your health care provider a list of all the medicines, herbs, non-prescription drugs, or dietary supplements  you use. Also tell them if you smoke, drink alcohol, or use illegal drugs. Some items may interact with your medicine. What should I watch for while using this medication? Visit your care team for regular checks on your progress. Tell your care team if your symptoms do not start to get better or if they get worse. You may need blood work done while you are taking this medication. You may need to eat more foods that contain iron. Talk to your care team. Foods that contain iron include whole grains or cereals, dried fruits, beans, peas, leafy green vegetables, and  organ meats (liver, kidney). What side effects may I notice from receiving this medication? Side effects that you should report to your care team as soon as possible: Allergic reactions--skin rash, itching, hives, swelling of the face, lips, tongue, or throat Low blood pressure--dizziness, feeling faint or lightheaded, blurry vision Shortness of breath Side effects that usually do not require medical attention (report to your care team if they continue or are bothersome): Flushing Headache Joint pain Muscle pain Nausea Pain, redness, or irritation at injection site This list may not describe all possible side effects. Call your doctor for medical advice about side effects. You may report side effects to FDA at 1-800-FDA-1088. Where should I keep my medication? This medication is given in a hospital or clinic. It will not be stored at home. NOTE: This sheet is a summary. It may not cover all possible information. If you have questions about this medicine, talk to your doctor, pharmacist, or health care provider.  2024 Elsevier/Gold Standard (2023-01-17 00:00:00)

## 2023-09-11 NOTE — Progress Notes (Signed)
 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 Her stool was positive for blood on 09/05/2023 She has also spat out some small amounts of blood since last week. She stopped her Eliquis last week on Friday and this seems to have helped.  Her stool remains dark. Retic count is elevated.  Iron saturation is only 6% and ferritin 20. Hgb has dropped down to 6.9, MCV 91%, platelets 304 and WBC count 10.1.  She is symptomatic with fatigue, weakness, chills, SOB, dizziness with falls, numbness and tingling in the fingers, swelling in the lower extremities (no pain, pitting or redness) and toes and epigastric pain.  She was followed by Dr. 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 She is followed closely by nephrology. She sees them again next week for recurrent UTI. ID has been involved for antibiotic management.  BUN 30 and creatinine improved at 1.72.  She has been under a lot of stress since losing her husband back in August (2024) and with her health. She has history atrial fib with ablation on 01/03/2023 with EP. She is also followed by cardiology.  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 No other cancer history.  Her father had lung cancer (smoker).  She is prediabetic with Hgb A1c 6.3. She manages with diet and Saxenda which is currently on hold due to GI bleed. Hypothyroidism managed with daily synthroid. TSH 1.01 and free T4 1.14.  No fever,  cough, rash, chest pain, palpitations or bladder habits.  Appetite is poor but she is trying to stay well hydrated. Weight is 235 lbs.  No smoking, ETOH or recreational drug use.  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   ROS: All other 10 point review of systems is negative.   PAST MEDICAL HISTORY:   Past Medical History:  Diagnosis Date   Allergy    Anemia    Blood transfusion without reported diagnosis    End stage renal disease (HCC)    GERD (gastroesophageal reflux disease)    HTN (hypertension)    Hyperlipidemia    Hypothyroidism    Osteopenia    Prolapsed internal hemorrhoids, grade 2 12/08/2016   Status post dilation of esophageal narrowing     ALLERGIES: Allergies  Allergen Reactions   Bee Venom    Diovan [Valsartan]     Pt said possible the reason she went into kidney failure.   Privigen [Immune Globulin (Human)]     Cause muscle contortions all over my body per Pt.   Erythromycin Rash      MEDICATIONS:  Current Outpatient Medications on File Prior to Visit  Medication Sig Dispense Refill   acetaminophen (TYLENOL) 500 MG tablet Take by mouth.     amoxicillin-clavulanate (AUGMENTIN) 875-125 MG tablet Take 1 tablet by mouth 2 (two) times daily. 20 tablet 0   atorvastatin (LIPITOR) 10 MG tablet Take 10 mg  by mouth daily.     cyanocobalamin 1000 MCG tablet Take 1,000 mcg by mouth every other day.     cycloSPORINE modified (NEORAL) 25 MG capsule Take 150 mg by mouth 2 (two) times daily.     EPINEPHrine 0.3 mg/0.3 mL IJ SOAJ injection Inject 0.3 mLs (0.3 mg total) into the muscle as needed for anaphylaxis. 1 each 0   estradiol (ESTRACE) 2 MG tablet Take 1 tablet by mouth daily.     fluocinonide cream (LIDEX) 0.05 % Apply 1 application. topically 2 (two) times daily.     folic acid (FOLVITE) 1 MG tablet Take 1 mg by mouth daily.     furosemide (LASIX) 40 MG tablet Take 40 mg by mouth as needed.     Iron, Ferrous  Sulfate, 325 (65 Fe) MG TABS Take 325 mg by mouth daily. 60 tablet 0   levothyroxine (SYNTHROID, LEVOTHROID) 112 MCG tablet Take 112 mcg by mouth daily before breakfast.     losartan (COZAAR) 50 MG tablet      Magnesium Oxide (MAG-OXIDE PO) Take 266 mg by mouth 2 (two) times daily.     multivitamin-lutein (OCUVITE-LUTEIN) CAPS Take 1 capsule by mouth daily.     mycophenolate (MYFORTIC) 360 MG TBEC EC tablet mycophenolate sodium 360 mg tablet,delayed release     omeprazole (PRILOSEC) 20 MG capsule Take 20 mg by mouth 2 (two) times daily before a meal.     ondansetron (ZOFRAN) 4 MG tablet Take 4 mg by mouth every 8 (eight) hours as needed for nausea or vomiting.     OVER THE COUNTER MEDICATION CYA Target     predniSONE (DELTASONE) 5 MG tablet Take 5 mg by mouth daily with breakfast.     PREMARIN 1.25 MG tablet Take 1.25 mg by mouth Daily.     SAXENDA 18 MG/3ML SOPN daily.     sodium bicarbonate 650 MG tablet Take 1,300 mg by mouth 3 (three) times daily.      sulfamethoxazole-trimethoprim (BACTRIM,SEPTRA) 400-80 MG tablet Take 1 tablet by mouth 3 (three) times a week.     triamcinolone cream (KENALOG) 0.1 % Apply 1 application. topically 2 (two) times daily.     No current facility-administered medications on file prior to visit.     PAST SURGICAL HISTORY Past Surgical History:  Procedure Laterality Date   CESAREAN SECTION  1980, 1984   COLONOSCOPY     EXPLORATORY LAPAROTOMY  1983   HEMORRHOID BANDING     hysterectomy     KIDNEY TRANSPLANT  2013   Left ankle fracture repair     POLYPECTOMY     UPPER GASTROINTESTINAL ENDOSCOPY      FAMILY HISTORY: Family History  Problem Relation Age of Onset   Crohn's disease Son    Heart disease Father    Lung cancer Father        smoker   Heart disease Mother    Heart disease Paternal Grandfather        both sets of grandparents   Kidney disease Maternal Grandmother    Colon cancer Neg Hx    Esophageal cancer Neg Hx    Pancreatic cancer  Neg Hx    Rectal cancer Neg Hx    Stomach cancer Neg Hx    Colon polyps Neg Hx     SOCIAL HISTORY:  reports that she has never smoked. She has never used smokeless tobacco. She reports that she does not drink alcohol and does not use drugs.  PERFORMANCE STATUS: The  patient's performance status is 2 - Symptomatic, <50% confined to bed  PHYSICAL EXAM: Most Recent Vital Signs: Blood pressure (!) 138/52, pulse 79, temperature 98 F (36.7 C), temperature source Oral, resp. rate 16, SpO2 100%. BP (!) 138/52 (BP Location: Right Arm, Patient Position: Sitting)   Pulse 79   Temp 98 F (36.7 C) (Oral)   Resp 16   SpO2 100%   General Appearance:    Alert, cooperative, no distress, appears stated age  Head:    Normocephalic, without obvious abnormality, atraumatic  Eyes:    PERRL, conjunctiva/corneas clear, EOM's intact, fundi    benign, both eyes        Throat:   Lips, mucosa, and tongue normal; teeth and gums normal  Neck:   Supple, symmetrical, trachea midline, no adenopathy;    thyroid:  no enlargement/tenderness/nodules; no carotid   bruit or JVD  Back:     Symmetric, no curvature, ROM normal, no CVA tenderness  Lungs:     Clear to auscultation bilaterally, respirations unlabored  Chest Wall:    No tenderness or deformity   Heart:    Regular rate and rhythm, S1 and S2 normal, no murmur, rub   or gallop     Abdomen:     Soft, non-tender, bowel sounds active all four quadrants,    no masses, no organomegaly        Extremities:   Extremities normal, atraumatic, no cyanosis or edema  Pulses:   2+ and symmetric all extremities  Skin:   Skin color, texture, turgor normal, no rashes or lesions  Lymph nodes:   Cervical, supraclavicular, and axillary nodes normal  Neurologic:   CNII-XII intact, normal strength, sensation and reflexes    throughout    LABORATORY DATA:  Results for orders placed or performed in visit on 09/11/23 (from the past 48 hours)  CBC with Differential (Cancer  Center Only)     Status: Abnormal   Collection Time: 09/11/23  8:20 AM  Result Value Ref Range   WBC Count 10.1 4.0 - 10.5 K/uL   RBC 2.61 (L) 3.87 - 5.11 MIL/uL   Hemoglobin 6.9 (LL) 12.0 - 15.0 g/dL    Comment: This critical result has verified and been called to [facility] by Zetta Bills on 04 01 2025 at 0926, and has been read back.    HCT 23.9 (L) 36.0 - 46.0 %   MCV 91.6 80.0 - 100.0 fL   MCH 26.4 26.0 - 34.0 pg   MCHC 28.9 (L) 30.0 - 36.0 g/dL   RDW 16.1 (H) 09.6 - 04.5 %   Platelet Count 304 150 - 400 K/uL   nRBC 0.0 0.0 - 0.2 %   Neutrophils Relative % 59 %   Neutro Abs 5.9 1.7 - 7.7 K/uL   Lymphocytes Relative 28 %   Lymphs Abs 2.9 0.7 - 4.0 K/uL   Monocytes Relative 9 %   Monocytes Absolute 0.9 0.1 - 1.0 K/uL   Eosinophils Relative 4 %   Eosinophils Absolute 0.4 0.0 - 0.5 K/uL   Basophils Relative 0 %   Basophils Absolute 0.0 0.0 - 0.1 K/uL   Smear Review LARGE PLATELETS PRESENT.    Immature Granulocytes 0 %   Abs Immature Granulocytes 0.04 0.00 - 0.07 K/uL   Reactive, Benign Lymphocytes PRESENT    Polychromasia PRESENT     Comment: Performed at [facility] at [facility], [address], [place], [place] [address]  CMP (Cancer  Center only)     Status: Abnormal   Collection Time: 09/11/23  8:20 AM  Result Value Ref Range   Sodium 142 135 - 145 mmol/L   Potassium 4.4 3.5 - 5.1 mmol/L   Chloride 107 98 - 111 mmol/L   CO2 24 22 - 32 mmol/L   Glucose, Bld 105 (H) 70 - 99 mg/dL    Comment: Glucose reference range applies only to samples taken after fasting for at least 8 hours.   BUN 30 (H) 8 - 23 mg/dL   Creatinine 4.09 (H) 8.11 - 1.00 mg/dL   Calcium 9.0 8.9 - 91.4 mg/dL   Total Protein 6.3 (L) 6.5 - 8.1 g/dL   Albumin 3.8 3.5 - 5.0 g/dL   AST 10 (L) 15 - 41 U/L   ALT 10 0 - 44 U/L   Alkaline Phosphatase 121 38 - 126 U/L   Total Bilirubin 0.8 0.0 - 1.2 mg/dL   GFR, Estimated 32 (L) >60 mL/min    Comment: (NOTE) Calculated using  the CKD-EPI Creatinine Equation (2021)    Anion gap 11 5 - 15    Comment: Performed at [facility] at [facility], [address], [place], [place] [address]  Lactate dehydrogenase (LDH)     Status: None   Collection Time: 09/11/23  8:21 AM  Result Value Ref Range   LDH 177 98 - 192 U/L    Comment: Performed at [facility] at [facility], [address] [place], [place] [address]  Reticulocytes     Status: Abnormal   Collection Time: 09/11/23  8:22 AM  Result Value Ref Range   Retic Ct Pct 5.3 (H) 0.4 - 3.1 %   RBC. 2.59 (L) 3.87 - 5.11 MIL/uL   Retic Count, Absolute 138.3 19.0 - 186.0 K/uL   Immature Retic Fract 41.7 (H) 2.3 - 15.9 %    Comment: Performed at [facility] at [facility], [address] [place], [place] [address]  Sample to Blood Bank     Status: None   Collection Time: 09/11/23  8:22 AM  Result Value Ref Range   Blood Bank Specimen SAMPLE AVAILABLE FOR TESTING    Sample Expiration      09/14/2023,2359 Performed at [facility], [address] [place], [place] [address]       RADIOGRAPHY: No results found.     PATHOLOGY: None   ASSESSMENT/PLAN: Ms. Luckett is a very pleasant 68 yo caucasian female with anemia secondary to GI blood loss. Her stool was positive for blood on 09/05/2023.  We will transfuse 2 units of blood and give monoferric today for anemia and iron deficiency.  Urgent referral placed with GI for Dr. Barron Alvine.  Epo is pending. She may also benefit again from an ESA. History of right kidney transplant.   Follow-up in 2 weeks with our office.   All questions were answered. The patient knows to call the clinic with any problems, questions or concerns. We can certainly see the patient much sooner if necessary.  The patient was discussed with Dr. Myna Hidalgo and he is in agreement with the aforementioned.   Eileen Stanford, NP

## 2023-09-11 NOTE — Telephone Encounter (Signed)
 Will you call nephrologist office and see what they say regarding my question regarding use of augmentin  for her resistant bacteria in urine and effect on mycophenolate. If they don't give answer then would you ask Sandy Moore if she has gotten pt in with infectious disease. If no success with nephrology and no appointment with infectious disease by Thursday of the week  then call pt and advise pt to be seen in the ED. Pt has expressed to me she is very reluctant to go to ED but still advise if no response from nephrology or no appointment with Infectious then ED best option.

## 2023-09-11 NOTE — Telephone Encounter (Signed)
 Will you call infectious disease MD office and follow up on referral. Can you get answer on whether patient can be seen this week. Please update me by tomorrow 09/12/2023.

## 2023-09-11 NOTE — Telephone Encounter (Signed)
 Inbound call from patient stating that she had referral sent to our office and was needing to schedule.  Patients referral is from her PCP Esperanza Richters, and is needing to be seen for Anemia and blood in stool. The referral is also direct to Dr. Barron Alvine. There is a note from PCP that states " Positive ifob with hx of anemia years ago and former pt of Dr Myna Hidalgo I think about 10 years ago. Pt had anemia prior to her kidney transplant yearsa go. 7 month ago hb was 11.6 ad hct was 35.2. 1 week ago hb from other office was 8. Today lab came back showing hb 7.6 and hct 24.8. Iron level low also. Pt in process of seeing Dr. Myna Hidalgo again but also wanted to get in with GI MD quickly as well".   Patient states she needs to be seen sooner than later and is currently getting her 2nd blood infusion. Patient is requesting a call back to discuss and to see if she can be seen sooner than May or June. Please advise.

## 2023-09-12 ENCOUNTER — Encounter: Payer: Self-pay | Admitting: Family

## 2023-09-12 ENCOUNTER — Telehealth: Payer: Self-pay | Admitting: Hematology & Oncology

## 2023-09-12 LAB — TYPE AND SCREEN
ABO/RH(D): O NEG
Antibody Screen: NEGATIVE
Unit division: 0
Unit division: 0

## 2023-09-12 LAB — BPAM RBC
Blood Product Expiration Date: 202505082359
Blood Product Expiration Date: 202505082359
ISSUE DATE / TIME: 202504011121
ISSUE DATE / TIME: 202504011121
Unit Type and Rh: 9500
Unit Type and Rh: 9500

## 2023-09-12 LAB — ERYTHROPOIETIN: Erythropoietin: 94.3 m[IU]/mL — ABNORMAL HIGH (ref 2.6–18.5)

## 2023-09-12 NOTE — Telephone Encounter (Signed)
 Rx sent in

## 2023-09-12 NOTE — Telephone Encounter (Signed)
 Patient has been made aware of appointment on 4/7 at 9:30.

## 2023-09-12 NOTE — Telephone Encounter (Signed)
 Pt did pickup medication.  She spoke with ID this morning and they stated that with her having kidney transplant and low hemoglobin that its not unusual to have a UTI.  They are ok with plan and advised her that she can all back at anytime with any other issues.  She is going to get endoscopy and colonoscopy done.  Also she will be going back to Dr. Tama Gander office to recheck hemoglobin.  At their office her hemoglobin was 6.9 and they transfused her 2 units Ferritin.

## 2023-09-12 NOTE — Telephone Encounter (Signed)
 Per [employer] its ok to do the Augmentin.  She stated short term is ok.

## 2023-09-12 NOTE — Progress Notes (Signed)
 Done

## 2023-09-12 NOTE — Telephone Encounter (Signed)
 Called to schedule lab only appt per inbasket. LVM to return call for scheduling.

## 2023-09-12 NOTE — Telephone Encounter (Signed)
 No adjustment needed per [facility]

## 2023-09-14 ENCOUNTER — Other Ambulatory Visit: Payer: Self-pay | Admitting: *Deleted

## 2023-09-14 ENCOUNTER — Inpatient Hospital Stay

## 2023-09-14 ENCOUNTER — Encounter: Payer: Self-pay | Admitting: *Deleted

## 2023-09-14 DIAGNOSIS — D500 Iron deficiency anemia secondary to blood loss (chronic): Secondary | ICD-10-CM

## 2023-09-14 DIAGNOSIS — D649 Anemia, unspecified: Secondary | ICD-10-CM

## 2023-09-14 DIAGNOSIS — K922 Gastrointestinal hemorrhage, unspecified: Secondary | ICD-10-CM

## 2023-09-14 LAB — CBC WITH DIFFERENTIAL (CANCER CENTER ONLY)
Abs Immature Granulocytes: 0.1 K/uL — ABNORMAL HIGH (ref 0.00–0.07)
Basophils Absolute: 0.1 K/uL (ref 0.0–0.1)
Basophils Relative: 1 %
Eosinophils Absolute: 0.4 K/uL (ref 0.0–0.5)
Eosinophils Relative: 3 %
HCT: 35.2 % — ABNORMAL LOW (ref 36.0–46.0)
Hemoglobin: 10.8 g/dL — ABNORMAL LOW (ref 12.0–15.0)
Immature Granulocytes: 1 %
Lymphocytes Relative: 16 %
Lymphs Abs: 1.9 K/uL (ref 0.7–4.0)
MCH: 28.6 pg (ref 26.0–34.0)
MCHC: 30.7 g/dL (ref 30.0–36.0)
MCV: 93.4 fL (ref 80.0–100.0)
Monocytes Absolute: 0.8 K/uL (ref 0.1–1.0)
Monocytes Relative: 7 %
Neutro Abs: 8.4 K/uL — ABNORMAL HIGH (ref 1.7–7.7)
Neutrophils Relative %: 72 %
Platelet Count: 328 K/uL (ref 150–400)
RBC: 3.77 MIL/uL — ABNORMAL LOW (ref 3.87–5.11)
RDW: 19.2 % — ABNORMAL HIGH (ref 11.5–15.5)
WBC Count: 11.7 K/uL — ABNORMAL HIGH (ref 4.0–10.5)
nRBC: 0 % (ref 0.0–0.2)

## 2023-09-14 LAB — SAMPLE TO BLOOD BANK

## 2023-09-14 NOTE — Progress Notes (Signed)
 Patient notified in lobby of HGB-10.8 and no need for any further blood transfusion at this time. Patient is appreciative of assistance and will f/u with GI at her already scheduled appt on Monday, 09/17/23.

## 2023-09-17 ENCOUNTER — Encounter: Payer: Self-pay | Admitting: Gastroenterology

## 2023-09-17 ENCOUNTER — Other Ambulatory Visit (INDEPENDENT_AMBULATORY_CARE_PROVIDER_SITE_OTHER)

## 2023-09-17 ENCOUNTER — Ambulatory Visit (INDEPENDENT_AMBULATORY_CARE_PROVIDER_SITE_OTHER): Admitting: Gastroenterology

## 2023-09-17 VITALS — BP 138/80 | HR 76 | Ht 64.0 in | Wt 230.0 lb

## 2023-09-17 DIAGNOSIS — I4891 Unspecified atrial fibrillation: Secondary | ICD-10-CM

## 2023-09-17 DIAGNOSIS — R195 Other fecal abnormalities: Secondary | ICD-10-CM | POA: Diagnosis not present

## 2023-09-17 DIAGNOSIS — K449 Diaphragmatic hernia without obstruction or gangrene: Secondary | ICD-10-CM

## 2023-09-17 DIAGNOSIS — R042 Hemoptysis: Secondary | ICD-10-CM

## 2023-09-17 DIAGNOSIS — D509 Iron deficiency anemia, unspecified: Secondary | ICD-10-CM

## 2023-09-17 LAB — COMPREHENSIVE METABOLIC PANEL WITH GFR
ALT: 17 U/L (ref 0–35)
AST: 16 U/L (ref 0–37)
Albumin: 4.1 g/dL (ref 3.5–5.2)
Alkaline Phosphatase: 108 U/L (ref 39–117)
BUN: 32 mg/dL — ABNORMAL HIGH (ref 6–23)
CO2: 21 meq/L (ref 19–32)
Calcium: 9.4 mg/dL (ref 8.4–10.5)
Chloride: 107 meq/L (ref 96–112)
Creatinine, Ser: 1.2 mg/dL (ref 0.40–1.20)
GFR: 46.51 mL/min — ABNORMAL LOW (ref >60.00)
Glucose, Bld: 112 mg/dL — ABNORMAL HIGH (ref 70–99)
Potassium: 4.1 meq/L (ref 3.5–5.1)
Sodium: 139 meq/L (ref 135–145)
Total Bilirubin: 0.7 mg/dL (ref 0.2–1.2)
Total Protein: 6.8 g/dL (ref 6.0–8.3)

## 2023-09-17 LAB — CBC WITH DIFFERENTIAL/PLATELET
Basophils Absolute: 0.1 10*3/uL (ref 0.0–0.1)
Basophils Relative: 0.7 % (ref 0.0–3.0)
Eosinophils Absolute: 0.3 10*3/uL (ref 0.0–0.7)
Eosinophils Relative: 2.7 % (ref 0.0–5.0)
HCT: 35.7 % — ABNORMAL LOW (ref 36.0–46.0)
Hemoglobin: 11.5 g/dL — ABNORMAL LOW (ref 12.0–15.0)
Lymphocytes Relative: 14.3 % (ref 12.0–46.0)
Lymphs Abs: 1.5 10*3/uL (ref 0.7–4.0)
MCHC: 32.1 g/dL (ref 30.0–36.0)
MCV: 91.1 fl (ref 78.0–100.0)
Monocytes Absolute: 0.8 10*3/uL (ref 0.1–1.0)
Monocytes Relative: 7.2 % (ref 3.0–12.0)
Neutro Abs: 8 10*3/uL — ABNORMAL HIGH (ref 1.4–7.7)
Neutrophils Relative %: 75.1 % (ref 43.0–77.0)
Platelets: 284 10*3/uL (ref 150.0–400.0)
RBC: 3.92 Mil/uL (ref 3.87–5.11)
RDW: 21.2 % — ABNORMAL HIGH (ref 11.5–15.5)
WBC: 10.6 10*3/uL — ABNORMAL HIGH (ref 4.0–10.5)

## 2023-09-17 LAB — IBC + FERRITIN
Ferritin: 560.6 ng/mL — ABNORMAL HIGH (ref 10.0–291.0)
Iron: 82 ug/dL (ref 42–145)
Saturation Ratios: 26.1 % (ref 20.0–50.0)
TIBC: 313.6 ug/dL (ref 250.0–450.0)
Transferrin: 224 mg/dL (ref 212.0–360.0)

## 2023-09-17 MED ORDER — NA SULFATE-K SULFATE-MG SULF 17.5-3.13-1.6 GM/177ML PO SOLN
1.0000 | Freq: Once | ORAL | 0 refills | Status: AC
Start: 2023-09-17 — End: 2023-09-17

## 2023-09-17 NOTE — Patient Instructions (Signed)
 Your provider has requested that you go to the basement level for lab work before leaving today. Press "B" on the elevator. The lab is located at the first door on the left as you exit the elevator.  You have been scheduled for an endoscopy and colonoscopy. Please follow the written instructions given to you at your visit today.  If you use inhalers (even only as needed), please bring them with you on the day of your procedure.  DO NOT TAKE 7 DAYS PRIOR TO TEST- Trulicity (dulaglutide) Ozempic, Wegovy (semaglutide) Mounjaro (tirzepatide) Bydureon Bcise (exanatide extended release)  DO NOT TAKE 1 DAY PRIOR TO YOUR TEST Rybelsus (semaglutide) Adlyxin (lixisenatide) Victoza (liraglutide) Byetta (exanatide) ___________________________________________________________________________  Due to recent changes in healthcare laws, you may see the results of your imaging and laboratory studies on MyChart before your provider has had a chance to review them.  We understand that in some cases there may be results that are confusing or concerning to you. Not all laboratory results come back in the same time frame and the provider may be waiting for multiple results in order to interpret others.  Please give [name] 48 hours in order for your provider to thoroughly review all the results before contacting the office for clarification of your results.   _______________________________________________________  If your blood pressure at your visit was 140/90 or greater, please contact your primary care physician to follow up on this.  _______________________________________________________  If you are age 55 or older, your body mass index should be between 23-30. Your Body mass index is 39.48 kg/m. If this is out of the aforementioned range listed, please consider follow up with your Primary Care Provider.  If you are age 71 or younger, your body mass index should be between 19-25. Your Body mass index is  39.48 kg/m. If this is out of the aformentioned range listed, please consider follow up with your Primary Care Provider.   ________________________________________________________  The  GI providers would like to encourage you to use [facility] to communicate with providers for non-urgent requests or questions.  Due to long hold times on the telephone, sending your provider a message by [facility] may be a faster and more efficient way to get a response.  Please allow 48 business hours for a response.  Please remember that this is for non-urgent requests.  _______________________________________________________  Thank you for trusting me with your gastrointestinal care!   Boone Master, PA

## 2023-09-17 NOTE — Progress Notes (Signed)
 Chief Complaint: IDA Primary GI MD: Dr. Christella Hartigan  HPI: 68 year old female ESRD s/p renal transplant 2013, PAF, and others as listed below presents for evaluation of IDA.  Longstanding history of anemia in which she saw Dr. Corinda Gubler for February 7 and had a colonoscopy/endoscopy at that time for further evaluation of anemia.  Colonoscopy showed multiple pinpoint erosions from transverse to descending colon with biopsy showing mild focal active ileitis and also "active colitis and a tubular adenoma"  with repeat recommended for 3 years.  EGD showed 3 cm hernia and 3 small antral polyps (not removed).  She did follow-up colonoscopy with Dr. Christella Hartigan in 2017 which showed 27 mm tubular adenoma in sigmoid and 18 mm tubular adenoma in sigmoid with repeat colonoscopy in 2020 as below with no polyps and recall 2025.  She had chest pressure January 2024 and found to have afib with RVR soon after passing of husband 12/2022 and underwent ablation 12/2022. cardioversion 03/2023. [facility]. Was put on Eliquis but then self discontinued late march after developing a few days of hemoptysis which has since resolved.  Labs reviewed 07/2011: Hgb 11.5, MCV 98 10/2022: TIBC 283, iron 48, saturation 17%, ferritin 170, Hgb 12.3 01/2023: Hgb 11.1, MCV 92 05/2023: Hgb 10.9, MCV 91 09/05/2023 Hgb 7.6, MCV 86.9 09/11/2023:  Hgb 6.9, MCV 91.6, 2 units PRBCs and 1 unit ferritin ordered 09/15/2023: Hgb 10.8, MCV 93.4  States she has had positive hemoccult but has had no overt bleeding. Started having black stools with iron supplementation. No GI issues.   PREVIOUS GI WORKUP   Colonoscopy 04/2019 - Diverticulosis in the left colon.  - The sites of 2017 sigmoid colon resections were both clearly located with aid of previous Spot injections. There was no recurrent polyp at either site.  - Internal and external hemorrhoids.  - The examination was otherwise normal on direct and retroflexion views.  - No  polyps or cancers. - recall 5 years  Colonoscopy 01/2016 for change in bowel habits - The examined portion of the ileum was normal.  - Mild inflammation was found in the rectum, in the sigmoid colon, in the descending colon and at the splenic flexure. Biopsied ( Jar 2) . The right colon was also biopsied ( separate jar 1)  - One 27 mm polyp in the proximal sigmoid colon, removed with a hot snare. Resected and retrieved ( jar 3) . Tattooed.  - One 18 mm polyp in the distal sigmoid colon, removed with a hot snare. Resected and retrieved ( jar 4) . Tattooed.  - External and internal hemorrhoids.  Diagnosis 1. Surgical [P], random sites righ colon -BENIGN COLONIC MUCOSA WITH NO HISTOPATHOLOGIC ABNORMALITY. -NO EVIDENCE OF LYMPHOCYTIC OR COLLAGENOUS COLITIS, INFLAMMATORY BOWEL DISEASE OR MALIGNANCY. 2. Surgical [P], random sites right colon inflammation -BENIGN COLONIC MUCOSA WITH UNREMARKABLE LYMPHOID AGGREGATES. -NO EVIDENCE OF LYMPHOCYTIC OR COLLAGENOUS COLITIS, INFLAMMATORY BOWEL DISEASE OR MALIGNANCY. 3. Surgical [P], sigmoid proximal, polyp -TUBULAR ADENOMA. -NO HIGH GRADE DYSPLASIA OR MALIGNANCY IDENTIFIED. 4. Surgical [P], sigmoid distal, polyp -TUBULAR ADENOMA. -NO HIGH GRADE DYSPLASIA OR MALIGNANCY IDENTIFIED  Past Medical History:  Diagnosis Date   Allergy    Anemia    Blood transfusion without reported diagnosis    End stage renal disease (HCC)    GERD (gastroesophageal reflux disease)    HTN (hypertension)    Hyperlipidemia    Hypothyroidism    Osteopenia    Prolapsed internal hemorrhoids, grade 2 12/08/2016   Status  post dilation of esophageal narrowing     Past Surgical History:  Procedure Laterality Date   CESAREAN SECTION  1980, 1984   COLONOSCOPY     EXPLORATORY LAPAROTOMY  1983   HEMORRHOID BANDING     hysterectomy     KIDNEY TRANSPLANT  2013   Left ankle fracture repair     POLYPECTOMY     UPPER GASTROINTESTINAL ENDOSCOPY      Current Outpatient  Medications  Medication Sig Dispense Refill   acetaminophen (TYLENOL) 500 MG tablet Take by mouth.     amoxicillin-clavulanate (AUGMENTIN) 875-125 MG tablet Take 1 tablet by mouth 2 (two) times daily. 20 tablet 0   atorvastatin (LIPITOR) 40 MG tablet Take 40 mg by mouth daily.     cyanocobalamin 1000 MCG tablet Take 1,000 mcg by mouth every other day.     cycloSPORINE modified (NEORAL) 25 MG capsule Take 150 mg by mouth 2 (two) times daily.     EPINEPHrine 0.3 mg/0.3 mL IJ SOAJ injection Inject 0.3 mLs (0.3 mg total) into the muscle as needed for anaphylaxis. 1 each 0   estradiol (ESTRACE) 2 MG tablet Take 1 tablet by mouth daily.     folic acid (FOLVITE) 1 MG tablet Take 1 mg by mouth daily.     furosemide (LASIX) 40 MG tablet Take 40 mg by mouth as needed.     Iron, Ferrous Sulfate, 325 (65 Fe) MG TABS Take 325 mg by mouth daily. 60 tablet 0   levothyroxine (SYNTHROID, LEVOTHROID) 112 MCG tablet Take 112 mcg by mouth daily before breakfast.     losartan (COZAAR) 50 MG tablet Take 50 mg by mouth 2 (two) times daily.     Magnesium Oxide (MAG-OXIDE PO) Take 266 mg by mouth 2 (two) times daily.     multivitamin-lutein (OCUVITE-LUTEIN) CAPS Take 1 capsule by mouth daily.     mycophenolate (MYFORTIC) 360 MG TBEC EC tablet mycophenolate sodium 360 mg tablet,delayed release     Na Sulfate-K Sulfate-Mg Sulfate concentrate (SUPREP) 17.5-3.13-1.6 GM/177ML SOLN Take 1 kit (354 mLs total) by mouth once for 1 dose. 354 mL 0   omeprazole (PRILOSEC) 20 MG capsule Take 20 mg by mouth 2 (two) times daily before a meal.     ondansetron (ZOFRAN) 4 MG tablet Take 4 mg by mouth every 8 (eight) hours as needed for nausea or vomiting.     OVER THE COUNTER MEDICATION CYA Target     predniSONE (DELTASONE) 5 MG tablet Take 5 mg by mouth daily with breakfast.     SAXENDA 18 MG/3ML SOPN daily.     sodium bicarbonate 650 MG tablet Take 1,300 mg by mouth 3 (three) times daily.      sulfamethoxazole-trimethoprim  (BACTRIM,SEPTRA) 400-80 MG tablet Take 1 tablet by mouth 3 (three) times a week.     traMADol (ULTRAM) 50 MG tablet Take 1 tablet by mouth every 4 (four) hours as needed.     No current facility-administered medications for this visit.    Allergies as of 09/17/2023 - Review Complete 09/17/2023  Allergen Reaction Noted   Bee venom  12/17/2019   Diovan [valsartan]  11/19/2015   Privigen [immune globulin (human)]  11/19/2015   Erythromycin Rash 11/19/2015    Family History  Problem Relation Age of Onset   Crohn's disease Son    Heart disease Father    Lung cancer Father        smoker   Heart disease Mother    Heart disease Paternal  Grandfather        both sets of grandparents   Kidney disease Maternal Grandmother    Colon cancer Neg Hx    Esophageal cancer Neg Hx    Pancreatic cancer Neg Hx    Rectal cancer Neg Hx    Stomach cancer Neg Hx    Colon polyps Neg Hx     Social History   Socioeconomic History   Marital status: Married    Spouse name: Not on file   Number of children: 2   Years of education: Not on file   Highest education level: Not on file  Occupational History   Occupation: RN Sports coach  Tobacco Use   Smoking status: Never   Smokeless tobacco: Never  Vaping Use   Vaping status: Never Used  Substance and Sexual Activity   Alcohol use: No    Alcohol/week: 0.0 standard drinks of alcohol   Drug use: No   Sexual activity: Not on file  Other Topics Concern   Not on file  Social History Narrative   Right Handed    Lives in a two story home    Social Drivers of Health   Financial Resource Strain: Not on file  Food Insecurity: Low Risk  (01/03/2023)   Received from [facility]   Hunger Vital Sign    Worried About Running Out of Food in the Last Year: Never true    Ran Out of Food in the Last Year: Never true  Transportation Needs: Not on file (01/03/2023)  Physical Activity: Not on file  Stress: No Stress Concern Present (06/25/2020)   Received  from [facility], [facility]   [employer] [facility] - Occupational Stress Questionnaire    Feeling of Stress : Only a little  Social Connections: Unknown (10/22/2021)   Received from [facility], [facility]   Social Network    Social Network: Not on file  Intimate Partner Violence: Low Risk  (08/08/2022)   Received from [facility] visits prior to 08/12/2022., [facility] visits prior to 08/12/2022.   Safety    How often does anyone, including family and friends, physically hurt you?: Never    How often does anyone, including family and friends, insult or talk down to you?: Never    How often does anyone, including family and friends, threaten you with harm?: Never    How often does anyone, including family and friends, scream or curse at you?: Never    Review of Systems:    Constitutional: No weight loss, fever, chills, weakness or fatigue HEENT: Eyes: No change in vision               Ears, Nose, Throat:  No change in hearing or congestion Skin: No rash or itching Cardiovascular: No chest pain, chest pressure or palpitations   Respiratory: No SOB or cough Gastrointestinal: See HPI and otherwise negative Genitourinary: No dysuria or change in urinary frequency Neurological: No headache, dizziness or syncope Musculoskeletal: No new muscle or joint pain Hematologic: No bleeding or bruising Psychiatric: No history of depression or anxiety    Physical Exam:  Vital signs: BP 138/80   Pulse 76   Ht 5\' 4"  (1.626 m)   Wt 230 lb (104.3 kg)   SpO2 98%   BMI 39.48 kg/m   Constitutional: NAD, Well developed, Well nourished, alert and cooperative Head:  Normocephalic and atraumatic. Eyes:   PEERL, EOMI. No icterus. Conjunctiva pink. Respiratory: Respirations even  and unlabored. Lungs clear to auscultation bilaterally.   No wheezes, crackles, or rhonchi.  Cardiovascular:  Regular rate and rhythm. No peripheral edema,  cyanosis or pallor.  Gastrointestinal:  Soft, nondistended, nontender. No rebound or guarding. Normal bowel sounds. No appreciable masses or hepatomegaly. Rectal:  Not performed.  Msk:  Symmetrical without gross deformities. Without edema, no deformity or joint abnormality.  Neurologic:  Alert and  oriented x4;  grossly normal neurologically.  Skin:   Dry and intact without significant lesions or rashes. Psychiatric: Oriented to person, place and time. Demonstrates good judgement and reason without abnormal affect or behaviors.  RELEVANT LABS AND IMAGING: CBC    Component Value Date/Time   WBC 11.7 (H) 09/14/2023 0935   WBC 10.0 09/05/2023 0936   RBC 3.77 (L) 09/14/2023 0935   HGB 10.8 (L) 09/14/2023 0935   HGB 11.5 (L) 07/17/2011 1458   HGB 12.0 09/26/2007 1015   HCT 35.2 (L) 09/14/2023 0935   HCT 35.3 07/17/2011 1458   HCT 35.1 09/26/2007 1015   PLT 328 09/14/2023 0935   PLT 266 07/17/2011 1458   PLT 295 09/26/2007 1015   MCV 93.4 09/14/2023 0935   MCV 92 07/17/2011 1458   MCV 90.2 09/26/2007 1015   MCH 28.6 09/14/2023 0935   MCHC 30.7 09/14/2023 0935   RDW 19.2 (H) 09/14/2023 0935   RDW 13.9 07/17/2011 1458   RDW 14.5 09/26/2007 1015   LYMPHSABS 1.9 09/14/2023 0935   LYMPHSABS 2.9 07/17/2011 1458   LYMPHSABS 2.4 09/26/2007 1015   MONOABS 0.8 09/14/2023 0935   MONOABS 0.7 09/26/2007 1015   EOSABS 0.4 09/14/2023 0935   EOSABS 0.8 (H) 07/17/2011 1458   BASOSABS 0.1 09/14/2023 0935   BASOSABS 0.0 07/17/2011 1458   BASOSABS 0.0 09/26/2007 1015    CMP     Component Value Date/Time   NA 142 09/11/2023 0820   NA 142 01/12/2010 0815   K 4.4 09/11/2023 0820   K 4.7 01/12/2010 0815   CL 107 09/11/2023 0820   CL 104 01/12/2010 0815   CO2 24 09/11/2023 0820   CO2 26 01/12/2010 0815   GLUCOSE 105 (H) 09/11/2023 0820   GLUCOSE 117 01/12/2010 0815   BUN 30 (H) 09/11/2023 0820   BUN 29 (H) 01/12/2010 0815   CREATININE 1.72 (H) 09/11/2023 0820   CREATININE 3.0 (H)  01/12/2010 0815   CALCIUM 9.0 09/11/2023 0820   CALCIUM 10.2 01/12/2010 0815   PROT 6.3 (L) 09/11/2023 0820   PROT 7.4 01/12/2010 0815   ALBUMIN 3.8 09/11/2023 0820   ALBUMIN 4.1 01/12/2010 0815   AST 10 (L) 09/11/2023 0820   ALT 10 09/11/2023 0820   ALT 15 01/12/2010 0815   ALKPHOS 121 09/11/2023 0820   ALKPHOS 109 (H) 01/12/2010 0815   BILITOT 0.8 09/11/2023 0820   GFRNONAA 32 (L) 09/11/2023 0820     Assessment/Plan:   Chronic IDA Had extensive workup in 2007 by Dr. Corinda Gubler and would like EGD/colonoscopy with EGD showing 3cam hiatal hernia, antral polyps (not removed) and pinpoint erosions in colon with bx showing active colitis and repeat colonoscopies with polyps (most recent in 2020 without polyps). Worsening anemia of the last year without over bleeding though she does have heme positive stool. Recently put on Eliquis 04/2023 which she stopped 08/2023 on her own due to continued anemia requiring transfusion despite oral iron. IDA multifactorial, though with previous 3cm hiatal hernia it is possible hiatal hernia has become larger and may have developed cameron's erosions exacerbated with  blood thinner initation. -- EGD/colonoscopy for further evaluation - Suspect she is an [facility] candidate since cardioversion was over 6 months ago.  Will confirm with Cathlyn Parsons, CRNA. If not, will move procedure to hospital -- I thoroughly discussed the procedure with the patient (at bedside) to include nature of the procedure, alternatives, benefits, and risks (including but not limited to bleeding, infection, perforation, anesthesia/cardiac pulmonary complications).  Patient verbalized understanding and gave verbal consent to proceed with procedure.  -- continue PPI BID -- CBC, CMP, IBC + ferritin, Vit b12/folate  AFIB History of ablation 12/2022 and cardioversion 04/2023 with initiation of eliquis 04/2023 (self discontinued 08/2023 due to worsening anemia)  ESRD s/p renal transplant  2013  Hemoptysis Only present for a few days which prompted her discontinuation of Eliquis. Has since resolved. 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 Patient declined chest xray -- TB Gold    This visit required 75 minutes of patient care (this includes precharting, chart review, review of results, face-to-face time used for counseling as well as treatment plan and follow-up. The patient was provided an opportunity to ask questions and all were answered. The patient agreed with the plan and demonstrated an understanding of the instructions.    Originally patient was referred to Dr. Barron Alvine. His first available procedure was in May. Patient preferred earlier appt. Soonest available was with Dr. Tomasa Rand. Pending [facility] candidacy she will have her procedure with Dr. Tomasa Rand due to availability but can remain a Dr. Barron Alvine patient if she wishes.  Lara Mulch [facility] 09/17/2023, 12:19 PM  Cc: Esperanza Richters, PA-C

## 2023-09-19 ENCOUNTER — Telehealth: Payer: Self-pay | Admitting: Medical

## 2023-09-19 NOTE — Telephone Encounter (Signed)
 I just got some oldl lab results from this patient. I saw her once and she wanted to see Dr. Carmelia Roller. He agreed to accept as patient(pt husband was pt of Dr. Carmelia Roller). Various med problems historicaly and acute issues recently. Please get pt scheuduled with pcp

## 2023-09-20 ENCOUNTER — Telehealth: Payer: Self-pay

## 2023-09-20 NOTE — Telephone Encounter (Signed)
 Copied from CRM [id number]. Topic: General - Other >> Sep 20, 2023 11:24 AM Arley Phenix D wrote: Reason for CRM: Patient wants to know if she needs to keep her appointment on 4/17 for the infusion. Patient stated that she just had her hemoglobin checked on the 7th, she has a gastro appointment on 4/29 and she has a lab check on 4/18. Patient just wants to know if Dr.Saguier still wants her to keep that appointment on 4/17 and to call her back once someone has an update.

## 2023-09-20 NOTE — Progress Notes (Signed)
 Agree with the assessment and plan as outlined by Boone Master, PA-C.

## 2023-09-21 NOTE — Telephone Encounter (Signed)
 Pt called , stated she was unaware of the infusion on 4/17 until she received a text message , stated she would prefer cancer center in [place] continue to manage , will cancel infusion on 09/27/23 and follow up with cancer center on 09/28/23 as planned from last visit

## 2023-09-27 ENCOUNTER — Encounter (HOSPITAL_COMMUNITY)

## 2023-09-28 ENCOUNTER — Inpatient Hospital Stay: Admitting: Family

## 2023-09-28 ENCOUNTER — Other Ambulatory Visit: Payer: Self-pay | Admitting: Family

## 2023-09-28 ENCOUNTER — Encounter: Payer: Self-pay | Admitting: Family

## 2023-09-28 ENCOUNTER — Inpatient Hospital Stay

## 2023-09-28 VITALS — BP 136/64 | HR 63 | Temp 98.1°F | Resp 20 | Ht 64.0 in | Wt 233.1 lb

## 2023-09-28 DIAGNOSIS — K922 Gastrointestinal hemorrhage, unspecified: Secondary | ICD-10-CM

## 2023-09-28 DIAGNOSIS — D500 Iron deficiency anemia secondary to blood loss (chronic): Secondary | ICD-10-CM

## 2023-09-28 DIAGNOSIS — D649 Anemia, unspecified: Secondary | ICD-10-CM

## 2023-09-28 LAB — CMP (CANCER CENTER ONLY)
ALT: 9 U/L (ref 0–44)
AST: 10 U/L — ABNORMAL LOW (ref 15–41)
Albumin: 4 g/dL (ref 3.5–5.0)
Alkaline Phosphatase: 83 U/L (ref 38–126)
Anion gap: 8 (ref 5–15)
BUN: 26 mg/dL — ABNORMAL HIGH (ref 8–23)
CO2: 24 mmol/L (ref 22–32)
Calcium: 9.5 mg/dL (ref 8.9–10.3)
Chloride: 109 mmol/L (ref 98–111)
Creatinine: 1.22 mg/dL — ABNORMAL HIGH (ref 0.44–1.00)
GFR, Estimated: 48 mL/min — ABNORMAL LOW (ref >60)
Glucose, Bld: 123 mg/dL — ABNORMAL HIGH (ref 70–99)
Potassium: 4.4 mmol/L (ref 3.5–5.1)
Sodium: 141 mmol/L (ref 135–145)
Total Bilirubin: 0.5 mg/dL (ref 0.0–1.2)
Total Protein: 6.2 g/dL — ABNORMAL LOW (ref 6.5–8.1)

## 2023-09-28 LAB — RETICULOCYTES
Immature Retic Fract: 15.2 % (ref 2.3–15.9)
RBC.: 3.78 MIL/uL — ABNORMAL LOW (ref 3.87–5.11)
Retic Count, Absolute: 75.2 10*3/uL (ref 19.0–186.0)
Retic Ct Pct: 2 % (ref 0.4–3.1)

## 2023-09-28 LAB — CBC WITH DIFFERENTIAL (CANCER CENTER ONLY)
Abs Immature Granulocytes: 0.07 10*3/uL (ref 0.00–0.07)
Basophils Absolute: 0.1 10*3/uL (ref 0.0–0.1)
Basophils Relative: 1 %
Eosinophils Absolute: 0.4 10*3/uL (ref 0.0–0.5)
Eosinophils Relative: 4 %
HCT: 35.4 % — ABNORMAL LOW (ref 36.0–46.0)
Hemoglobin: 11 g/dL — ABNORMAL LOW (ref 12.0–15.0)
Immature Granulocytes: 1 %
Lymphocytes Relative: 24 %
Lymphs Abs: 2.4 10*3/uL (ref 0.7–4.0)
MCH: 29.9 pg (ref 26.0–34.0)
MCHC: 31.1 g/dL (ref 30.0–36.0)
MCV: 96.2 fL (ref 80.0–100.0)
Monocytes Absolute: 0.8 10*3/uL (ref 0.1–1.0)
Monocytes Relative: 8 %
Neutro Abs: 6.1 10*3/uL (ref 1.7–7.7)
Neutrophils Relative %: 62 %
Platelet Count: 245 10*3/uL (ref 150–400)
RBC: 3.68 MIL/uL — ABNORMAL LOW (ref 3.87–5.11)
RDW: 19.9 % — ABNORMAL HIGH (ref 11.5–15.5)
WBC Count: 9.8 10*3/uL (ref 4.0–10.5)
nRBC: 0 % (ref 0.0–0.2)

## 2023-09-28 LAB — SAMPLE TO BLOOD BANK

## 2023-09-28 LAB — IRON AND IRON BINDING CAPACITY (CC-WL,HP ONLY)
Iron: 65 ug/dL (ref 28–170)
Saturation Ratios: 24 % (ref 10.4–31.8)
TIBC: 272 ug/dL (ref 250–450)
UIBC: 207 ug/dL (ref 148–442)

## 2023-09-28 LAB — FERRITIN: Ferritin: 259 ng/mL (ref 11–307)

## 2023-09-28 NOTE — Progress Notes (Signed)
 Hematology and Oncology Follow Up Visit  Sandy Moore [id number] 1956/02/18 68 y.o. 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 She has responded nicely to Monoferric . Hgb is 11.0, MCV 96, platelets 245 and WBC count is 9.8.  She is still feeling fatigued but does feel better.  Her stool is black but she states that this may be due to her taking the oral iron  daily.  No abnormal bruising, no petechiae.  GI plans to scope her on 10/09/2023.  No fever, chills, n/v, cough, rash, dizziness, SOB, chest pain, palpitations, abdominal pain or changes in bowel or bladder habits.  Chronic swelling in the lower extremities is stable. She has mild pitting. Pedal pulses are 1+.  No falls or syncope reported.  Appetite and hydration are good. Weight is stable at 233 lbs.   ECOG Performance Status: 1 - Symptomatic but completely ambulatory  Medications:  Allergies as of 09/28/2023       Reactions   Bee Venom Anaphylaxis   Diovan [valsartan] Other (See Comments)   Pt said possible the reason she went into kidney failure.   Privigen [immune Globulin (human)] Other (See Comments)   Cause muscle contortions all over my body per Pt.   Erythromycin Rash        Medication List        Accurate as of September 28, 2023 11:03 AM. If you have any questions, ask your nurse or doctor.          STOP taking these medications    amoxicillin -clavulanate 875-125 MG tablet Commonly known as: AUGMENTIN  Stopped by: Sandy Moore       TAKE these medications    acetaminophen  500 MG tablet Commonly known as: TYLENOL  Take by mouth.   atorvastatin 40 MG tablet Commonly known as: LIPITOR Take 40 mg by mouth daily.   cyanocobalamin  1000 MCG tablet Take 1,000 mcg by mouth every  other day.   cycloSPORINE  modified 25 MG capsule Commonly known as: NEORAL  Take 150 mg by mouth 2 (two) times daily.   EPINEPHrine  0.3 mg/0.3 mL Soaj injection Commonly known as: EPI-PEN Inject 0.3 mLs (0.3 mg total) into the muscle as needed for anaphylaxis.   estradiol 2 MG tablet Commonly known as: ESTRACE Take 1 tablet by mouth daily.   folic acid 1 MG tablet Commonly known as: FOLVITE Take 1 mg by mouth daily.   furosemide  40 MG tablet Commonly known as: LASIX  Take 40 mg by mouth as needed.   Iron  (Ferrous Sulfate ) 325 (65 Fe) MG Tabs Take 325 mg by mouth daily.   levothyroxine 112 MCG tablet Commonly known as: SYNTHROID Take 112 mcg by mouth daily before breakfast.   losartan 50 MG tablet Commonly known as: COZAAR Take 50 mg by mouth 2 (two) times daily.   MAG-OXIDE PO Take 266 mg by mouth 2 (two) times daily.   multivitamin-lutein Caps capsule Take 1 capsule by mouth daily.   mycophenolate 360 MG Tbec EC tablet Commonly known as: MYFORTIC mycophenolate sodium 360 mg tablet,delayed release   omeprazole 20 MG capsule Commonly known as: PRILOSEC Take 20 mg by mouth 2 (two) times daily before a meal.   ondansetron 4 MG tablet Commonly known as: ZOFRAN Take 4 mg by mouth every 8 (eight) hours as needed for nausea or  vomiting.   OVER THE COUNTER MEDICATION CYA Target   predniSONE  5 MG tablet Commonly known as: DELTASONE  Take 5 mg by mouth daily with breakfast.   Saxenda 18 MG/3ML Sopn Generic drug: Liraglutide -Weight Management daily.   sodium bicarbonate 650 MG tablet Take 1,300 mg by mouth 3 (three) times daily.   sulfamethoxazole-trimethoprim 400-80 MG tablet Commonly known as: BACTRIM Take 1 tablet by mouth 3 (three) times a week.   traMADol 50 MG tablet Commonly known as: ULTRAM Take 1 tablet by mouth every 4 (four) hours as needed.        Allergies:  Allergies  Allergen Reactions   Bee Venom Anaphylaxis   Diovan [Valsartan]  Other (See Comments)    Pt said possible the reason she went into kidney failure.   Privigen [Immune Globulin (Human)] Other (See Comments)    Cause muscle contortions all over my body per Pt.   Erythromycin Rash    Past Medical History, Surgical history, Social history, and Family History were reviewed and updated.  Review of Systems: All other 10 point review of systems is negative.   Physical Exam:  height is 5' 4 (1.626 m) and weight is 233 lb 1.3 oz (105.7 kg). Her oral temperature is 98.1 F (36.7 C). Her blood pressure is 136/64 and her pulse is 63. Her respiration is 20 and oxygen saturation is 99%.   Wt Readings from Last 3 Encounters:  09/28/23 233 lb 1.3 oz (105.7 kg)  09/17/23 230 lb (104.3 kg)  09/05/23 235 lb (106.6 kg)    Ocular: Sclerae unicteric, pupils equal, round and reactive to light Ear-nose-throat: Oropharynx clear, dentition fair Lymphatic: No cervical or supraclavicular adenopathy Lungs no rales or rhonchi, good excursion bilaterally Heart regular rate and rhythm, no murmur appreciated Abd soft, nontender, positive bowel sounds MSK no focal spinal tenderness, no joint edema Neuro: non-focal, well-oriented, appropriate affect Breasts: Deferred   Lab Results  Component Value Date   WBC 9.8 09/28/2023   HGB 11.0 (L) 09/28/2023   HCT 35.4 (L) 09/28/2023   MCV 96.2 09/28/2023   PLT 245 09/28/2023   Lab Results  Component Value Date   FERRITIN 560.6 (H) 09/17/2023   IRON  82 09/17/2023   TIBC 313.6 09/17/2023   UIBC 328 09/11/2023   IRONPCTSAT 26.1 09/17/2023   Lab Results  Component Value Date   RETICCTPCT 2.0 09/28/2023   RBC 3.78 (L) 09/28/2023   RBC 3.68 (L) 09/28/2023   RETICCTABS 51.4 07/15/2009   No results found for: KPAFRELGTCHN, LAMBDASER, KAPLAMBRATIO No results found for: IGGSERUM, IGA, IGMSERUM No results found for: STEPHANY CARLOTA BENSON MARKEL EARLA JOANNIE DOC, MSPIKE, SPEI    Chemistry      Component Value Date/Time   NA 141 09/28/2023 0938   NA 142 01/12/2010 0815   K 4.4 09/28/2023 0938   K 4.7 01/12/2010 0815   CL 109 09/28/2023 0938   CL 104 01/12/2010 0815   CO2 24 09/28/2023 0938   CO2 26 01/12/2010 0815   BUN 26 (H) 09/28/2023 0938   BUN 29 (H) 01/12/2010 0815   CREATININE 1.22 (H) 09/28/2023 0938   CREATININE 3.0 (H) 01/12/2010 0815      Component Value Date/Time   CALCIUM 9.5 09/28/2023 0938   CALCIUM 10.2 01/12/2010 0815   ALKPHOS 83 09/28/2023 0938   ALKPHOS 109 (H) 01/12/2010 0815   AST 10 (L) 09/28/2023 0938   ALT 9 09/28/2023 0938   ALT 15 01/12/2010 0815   BILITOT 0.5 09/28/2023 9061  Impression and Plan: Sandy Moore is a very pleasant 68 yo caucasian female with anemia secondary to GI blood loss.  Iron  studies are pending.  Lab next week so we can treat prior to her colonoscopy the following week if needed.  Follow-up in 4 weeks.   Sandy Pepper, NP 4/18/202511:03 AM

## 2023-10-04 ENCOUNTER — Encounter (HOSPITAL_COMMUNITY)

## 2023-10-05 ENCOUNTER — Inpatient Hospital Stay

## 2023-10-05 DIAGNOSIS — D500 Iron deficiency anemia secondary to blood loss (chronic): Secondary | ICD-10-CM | POA: Diagnosis not present

## 2023-10-05 DIAGNOSIS — K922 Gastrointestinal hemorrhage, unspecified: Secondary | ICD-10-CM

## 2023-10-05 LAB — CBC WITH DIFFERENTIAL (CANCER CENTER ONLY)
Abs Immature Granulocytes: 0.05 10*3/uL (ref 0.00–0.07)
Basophils Absolute: 0.1 10*3/uL (ref 0.0–0.1)
Basophils Relative: 1 %
Eosinophils Absolute: 0.5 10*3/uL (ref 0.0–0.5)
Eosinophils Relative: 5 %
HCT: 36.3 % (ref 36.0–46.0)
Hemoglobin: 11 g/dL — ABNORMAL LOW (ref 12.0–15.0)
Immature Granulocytes: 1 %
Lymphocytes Relative: 29 %
Lymphs Abs: 3.1 10*3/uL (ref 0.7–4.0)
MCH: 28.9 pg (ref 26.0–34.0)
MCHC: 30.3 g/dL (ref 30.0–36.0)
MCV: 95.5 fL (ref 80.0–100.0)
Monocytes Absolute: 1 10*3/uL (ref 0.1–1.0)
Monocytes Relative: 9 %
Neutro Abs: 6.1 10*3/uL (ref 1.7–7.7)
Neutrophils Relative %: 55 %
Platelet Count: 207 10*3/uL (ref 150–400)
RBC: 3.8 MIL/uL — ABNORMAL LOW (ref 3.87–5.11)
RDW: 19.9 % — ABNORMAL HIGH (ref 11.5–15.5)
WBC Count: 10.7 10*3/uL — ABNORMAL HIGH (ref 4.0–10.5)
nRBC: 0 % (ref 0.0–0.2)

## 2023-10-05 LAB — SAMPLE TO BLOOD BANK

## 2023-10-09 ENCOUNTER — Ambulatory Visit: Admitting: Gastroenterology

## 2023-10-09 ENCOUNTER — Encounter: Payer: Self-pay | Admitting: Gastroenterology

## 2023-10-09 VITALS — BP 178/67 | HR 59 | Temp 97.3°F | Resp 17 | Ht 64.0 in | Wt 230.0 lb

## 2023-10-09 DIAGNOSIS — K5730 Diverticulosis of large intestine without perforation or abscess without bleeding: Secondary | ICD-10-CM | POA: Diagnosis not present

## 2023-10-09 DIAGNOSIS — K2950 Unspecified chronic gastritis without bleeding: Secondary | ICD-10-CM | POA: Diagnosis not present

## 2023-10-09 DIAGNOSIS — K317 Polyp of stomach and duodenum: Secondary | ICD-10-CM

## 2023-10-09 DIAGNOSIS — K449 Diaphragmatic hernia without obstruction or gangrene: Secondary | ICD-10-CM | POA: Diagnosis not present

## 2023-10-09 DIAGNOSIS — N186 End stage renal disease: Secondary | ICD-10-CM

## 2023-10-09 DIAGNOSIS — K3189 Other diseases of stomach and duodenum: Secondary | ICD-10-CM | POA: Diagnosis not present

## 2023-10-09 DIAGNOSIS — K2951 Unspecified chronic gastritis with bleeding: Secondary | ICD-10-CM | POA: Diagnosis not present

## 2023-10-09 DIAGNOSIS — R195 Other fecal abnormalities: Secondary | ICD-10-CM

## 2023-10-09 DIAGNOSIS — Z9889 Other specified postprocedural states: Secondary | ICD-10-CM

## 2023-10-09 DIAGNOSIS — D509 Iron deficiency anemia, unspecified: Secondary | ICD-10-CM

## 2023-10-09 MED ORDER — SODIUM CHLORIDE 0.9 % IV SOLN
500.0000 mL | Freq: Once | INTRAVENOUS | Status: DC
Start: 2023-10-09 — End: 2023-10-09

## 2023-10-09 NOTE — Patient Instructions (Signed)
 Upper Endoscopy:   Await results of gastric polyp removed and gastric biopsies   Continue previous diet & medications  Continue to trend CBC as outpatient  Ok to decrease Omeprazole to once daily   Recommend resuming anticoagulation tomorrow ( 10/10/23)   Colonoscopy:  Handout on diverticulosis given to you today  Consider repeating Colonoscopy in 5 years   YOU HAD AN ENDOSCOPIC PROCEDURE TODAY AT THE [facility]:   Refer to the procedure report that was given to you for any specific questions about what was found during the examination.  If the procedure report does not answer your questions, please call your gastroenterologist to clarify.  If you requested that your care partner not be given the details of your procedure findings, then the procedure report has been included in a sealed envelope for you to review at your convenience later.  YOU SHOULD EXPECT: Some feelings of bloating in the abdomen. Passage of more gas than usual.  Walking can help get rid of the air that was put into your GI tract during the procedure and reduce the bloating. If you had a lower endoscopy (such as a colonoscopy or flexible sigmoidoscopy) you may notice spotting of blood in your stool or on the toilet paper. If you underwent a bowel prep for your procedure, you may not have a normal bowel movement for a few days.  Please Note:  You might notice some irritation and congestion in your nose or some drainage.  This is from the oxygen used during your procedure.  There is no need for concern and it should clear up in a day or so.  SYMPTOMS TO REPORT IMMEDIATELY:  Following lower endoscopy (colonoscopy or flexible sigmoidoscopy):  Excessive amounts of blood in the stool  Significant tenderness or worsening of abdominal pains  Swelling of the abdomen that is new, acute  Fever of 100F or higher  Following upper endoscopy (EGD)  Vomiting of blood or coffee ground material  New chest  pain or pain under the shoulder blades  Painful or persistently difficult swallowing  New shortness of breath  Fever of 100F or higher  Black, tarry-looking stools  For urgent or emergent issues, a gastroenterologist can be reached at any hour by calling [phone]. Do not use MyChart messaging for urgent concerns.    DIET:  We do recommend a small meal at first, but then you may proceed to your regular diet.  Drink plenty of fluids but you should avoid alcoholic beverages for 24 hours.  ACTIVITY:  You should plan to take it easy for the rest of today and you should NOT DRIVE or use heavy machinery until tomorrow (because of the sedation medicines used during the test).    FOLLOW UP: Our staff will call the number listed on your records the next business day following your procedure.  We will call around 7:15- 8:00 am to check on you and address any questions or concerns that you may have regarding the information given to you following your procedure. If we do not reach you, we will leave a message.     If any biopsies were taken you will be contacted by phone or by letter within the next 1-3 weeks.  Please call us  at [phone] if you have not heard about the biopsies in 3 weeks.    SIGNATURES/CONFIDENTIALITY: You and/or your care partner have signed paperwork which will be entered into your electronic medical record.  These signatures attest to  the fact that that the information above on your After Visit Summary has been reviewed and is understood.  Full responsibility of the confidentiality of this discharge information lies with you and/or your care-partner.

## 2023-10-09 NOTE — Progress Notes (Signed)
 Called to room to assist during endoscopic procedure.  Patient ID and intended procedure confirmed with present staff. Received instructions for my participation in the procedure from the performing physician.

## 2023-10-09 NOTE — Op Note (Signed)
 [facility] Patient Name: Sandy Moore Procedure Date: 10/09/2023 3:07 PM MRN: [id number] Endoscopist: Sandy Moore , MD, [id number] Age: 68 Referring MD:  Date of Birth: 01-17-1956 Gender: Female Account #: [id number] Procedure:                Upper GI endoscopy Indications:              Melena, Recent gastrointestinal bleeding Medicines:                Monitored Anesthesia Care Procedure:                Pre-Anesthesia Assessment:                           - Prior to the procedure, a History and Physical                            was performed, and patient medications and                            allergies were reviewed. The patient's tolerance of                            previous anesthesia was also reviewed. The risks                            and benefits of the procedure and the sedation                            options and risks were discussed with the patient.                            All questions were answered, and informed consent                            was obtained. Prior Anticoagulants: The patient has                            taken Eliquis (apixaban), last dose was 28 days                            prior to procedure. ASA Grade Assessment: III - A                            patient with severe systemic disease. After                            reviewing the risks and benefits, the patient was                            deemed in satisfactory condition to undergo the                            procedure.  After obtaining informed consent, the endoscope was                            passed under direct vision. Throughout the                            procedure, the patient's blood pressure, pulse, and                            oxygen saturations were monitored continuously. The                            GIF [id number] was introduced through the                            mouth, and advanced to the second  part of duodenum.                            The upper GI endoscopy was accomplished without                            difficulty. The patient tolerated the procedure                            well. Scope In: Scope Out: Findings:                 The examined portions of the nasopharynx,                            oropharynx and larynx were normal.                           The examined esophagus was normal.                           A 3 cm hiatal hernia was present.                           A single 6 mm sessile polyp with no bleeding and no                            stigmata of recent bleeding was found in the                            cardia. The polyp was removed with a cold snare.                            Resection and retrieval were complete. Estimated                            blood loss was minimal.                           Diffuse granular mucosa was found in  the entire                            examined stomach. Biopsies were taken with a cold                            forceps for Helicobacter pylori testing. Estimated                            blood loss was minimal.                           The exam of the stomach was otherwise normal.                           The examined duodenum was normal. Complications:            No immediate complications. Estimated Blood Loss:     Estimated blood loss was minimal. Impression:               - The examined portions of the nasopharynx,                            oropharynx and larynx were normal.                           - Normal esophagus.                           - 3 cm hiatal hernia. No Cameron's lesions present.                           - A single gastric polyp. Resected and retrieved.                            Suspect hyperplastic or inflammatory polyp                           - Granular gastric mucosa. Biopsied.                           - Normal examined duodenum.                           - No obvious source  of GI bleeding. The                            erythematous polyp in the cardia could potentially                            have bled, but no bleeding stigmata present. Recommendation:           - Patient has a contact number available for                            emergencies. The signs and symptoms of potential  delayed complications were discussed with the                            patient. Return to normal activities tomorrow.                            Written discharge instructions were provided to the                            patient.                           - Resume previous diet.                           - Continue present medications.                           - Await pathology results.                           - Continue to trend CBC as outpatient.                           - Ok to decrease omeprazole to once daily.                           - Would recommend resuming anticoagulation tomorrow. Sandy Viti E. Cherryl Corona, MD 10/09/2023 4:01:55 PM This report has been signed electronically.

## 2023-10-09 NOTE — Progress Notes (Signed)
 Pt's states no medical or surgical changes since previsit or office visit.

## 2023-10-09 NOTE — Progress Notes (Signed)
 Pt sedate, gd SR's, VSS, report to RN

## 2023-10-09 NOTE — Progress Notes (Signed)
 History and Physical Interval Note:  10/09/2023 3:01 PM  Sandy Moore  has presented today for endoscopic procedure(s), with the diagnosis of  Encounter Diagnoses  Name Primary?   Iron  deficiency anemia, unspecified iron  deficiency anemia type Yes   Heme positive stool    End stage renal disease (HCC)   .  The various methods of evaluation and treatment have been discussed with the patient and/or family. After consideration of risks, benefits and other options for treatment, the patient has consented to  the endoscopic procedure(s).   The patient's history has been reviewed, patient examined, no change in status, stable for endoscopic procedure(s).  I have reviewed the patient's chart and labs.  Questions were answered to the patient's satisfaction.     Bernarr Longsworth E. Cherryl Corona, MD [facility]

## 2023-10-09 NOTE — Op Note (Signed)
 [facility] Patient Name: Sandy Moore Procedure Date: 10/09/2023 3:06 PM MRN: [id number] Endoscopist: Geralyn Knee E. Cherryl Corona , MD, [id number] Age: 68 Referring MD:  Date of Birth: 1956/04/12 Gender: Female Account #: [id number] Procedure:                Colonoscopy Indications:              Melena, Gastrointestinal occult blood loss; history                            of large sigmoid polyp removal 2017, normal                            colonoscopy 2020 Medicines:                Monitored Anesthesia Care Procedure:                Pre-Anesthesia Assessment:                           - Prior to the procedure, a History and Physical                            was performed, and patient medications and                            allergies were reviewed. The patient's tolerance of                            previous anesthesia was also reviewed. The risks                            and benefits of the procedure and the sedation                            options and risks were discussed with the patient.                            All questions were answered, and informed consent                            was obtained. Prior Anticoagulants: The patient has                            taken Eliquis (apixaban), last dose was 28 days                            prior to procedure. ASA Grade Assessment: III - A                            patient with severe systemic disease. After                            reviewing the risks and benefits, the patient was  deemed in satisfactory condition to undergo the                            procedure.                           After obtaining informed consent, the colonoscope                            was passed under direct vision. Throughout the                            procedure, the patient's blood pressure, pulse, and                            oxygen saturations were monitored continuously. The                             Olympus Scope SN: [id number] was introduced through                            the anus and advanced to the the terminal ileum,                            with identification of the appendiceal orifice and                            IC valve. The colonoscopy was performed without                            difficulty. The patient tolerated the procedure                            well. The quality of the bowel preparation was                            adequate. The terminal ileum, ileocecal valve,                            appendiceal orifice, and rectum were photographed.                            The bowel preparation used was SUPREP via split                            dose instruction. Scope In: 3:35:44 PM Scope Out: 3:51:28 PM Scope Withdrawal Time: 0 hours 11 minutes 15 seconds  Total Procedure Duration: 0 hours 15 minutes 44 seconds  Findings:                 The perianal and digital rectal examinations were                            normal. Pertinent negatives include normal  sphincter tone and no palpable rectal lesions.                           Two tattoos were seen in the sigmoid colon. A                            post-polypectomy scar was found at the more distal                            tattoo site. There was no evidence of residual                            polyp tissue. A definitive polypectomy scar was not                            seen near the more proximal tattoo                           A few small-mouthed diverticula were found in the                            sigmoid colon.                           The exam was otherwise normal throughout the                            examined colon.                           The terminal ileum appeared normal.                           The retroflexed view of the distal rectum and anal                            verge was normal and showed no anal or rectal                             abnormalities. Complications:            No immediate complications. Estimated Blood Loss:     Estimated blood loss: none. Impression:               - A tattoo was seen in the sigmoid colon. A                            post-polypectomy scar was found at the tattoo site.                            There was no evidence of residual polyp tissue.                           - Mild diverticulosis in the sigmoid colon.                           -  The examined portion of the ileum was normal.                           - The distal rectum and anal verge are normal on                            retroflexion view.                           - No specimens collected. Recommendation:           - Patient has a contact number available for                            emergencies. The signs and symptoms of potential                            delayed complications were discussed with the                            patient. Return to normal activities tomorrow.                            Written discharge instructions were provided to the                            patient.                           - Resume previous diet.                           - Continue present medications.                           - Consider repeat colonoscopy in 5 years. Aniel Hubble E. Cherryl Corona, MD 10/09/2023 4:07:06 PM This report has been signed electronically.

## 2023-10-10 ENCOUNTER — Telehealth: Payer: Self-pay | Admitting: Gastroenterology

## 2023-10-10 ENCOUNTER — Telehealth: Payer: Self-pay | Admitting: *Deleted

## 2023-10-10 NOTE — Telephone Encounter (Signed)
 Inbound call from patient stating that she had a colonoscopy and endoscopy yesterday and wanted to let us  know when we receive results she would like them sent to her PCP Sylvia Everts and Dr. Charley Constable which is her kidney doctor. Please advise.

## 2023-10-10 NOTE — Telephone Encounter (Signed)
 Routing to [employer] for scheduling

## 2023-10-10 NOTE — Telephone Encounter (Signed)
  Follow up Call-     10/09/2023    2:37 PM  Call back number  Post procedure Call Back phone  # [phone]  Permission to leave phone message Yes     Patient questions:  Do you have a fever, pain , or abdominal swelling? No. Pain Score  0 *  Have you tolerated food without any problems? Yes.    Have you been able to return to your normal activities? Yes.    Do you have any questions about your discharge instructions: Diet   No. Medications  No. Follow up visit  No.  Do you have questions or concerns about your Care? No.  Actions: * If pain score is 4 or above: No action needed, pain <4.

## 2023-10-10 NOTE — Telephone Encounter (Signed)
 Noted.

## 2023-10-10 NOTE — Telephone Encounter (Signed)
 Dr.Wendling said later next week is fine. Gaylin Ke stated he is following up with the pt  about her test results.

## 2023-10-11 NOTE — Telephone Encounter (Signed)
 Scheduled pt

## 2023-10-12 LAB — SURGICAL PATHOLOGY

## 2023-10-16 ENCOUNTER — Ambulatory Visit (INDEPENDENT_AMBULATORY_CARE_PROVIDER_SITE_OTHER): Admitting: Family Medicine

## 2023-10-16 ENCOUNTER — Encounter: Payer: Self-pay | Admitting: Family Medicine

## 2023-10-16 VITALS — BP 130/84 | HR 65 | Temp 98.0°F | Resp 16 | Ht 64.0 in | Wt 236.2 lb

## 2023-10-16 DIAGNOSIS — F4321 Adjustment disorder with depressed mood: Secondary | ICD-10-CM

## 2023-10-16 DIAGNOSIS — D500 Iron deficiency anemia secondary to blood loss (chronic): Secondary | ICD-10-CM

## 2023-10-16 DIAGNOSIS — Z940 Kidney transplant status: Secondary | ICD-10-CM | POA: Diagnosis not present

## 2023-10-16 NOTE — Progress Notes (Signed)
 Chief Complaint  Patient presents with   Transitions Of Care    Transition of care    Subjective: Patient is a 68 y.o. female here for transfer of care.  I saw her husband who passed away unexpectedly from a heart attack in August 2024.  She is not fully over his death given the unsuspected nature of it.  She is following with the grief counseling team.  She has had a kidney transplant back in 2013.  She takes chronic medication from the nephrology and infectious disease team because of this.  She is compliant with her medications and had no adverse effects.  Around 1 month ago, she establish care with our office and was found to be severely anemic secondary to iron  deficiency.  She underwent appropriate treatment.  Her anticoagulant was stopped.  She was prescribed this for atrial fibrillation.  She had an ablation and has not had issues with atrial fibrillation since then.  She has a follow-up with her EP doc coming up.  She had an upper and lower endoscopy which did show polyps in her stomach.  Past Medical History:  Diagnosis Date   Allergy    Anemia    Blood transfusion without reported diagnosis    End stage renal disease (HCC)    GERD (gastroesophageal reflux disease)    HTN (hypertension)    Hyperlipidemia    Hypothyroidism    Osteopenia    Prolapsed internal hemorrhoids, grade 2 12/08/2016   Status post dilation of esophageal narrowing     Objective: BP 130/84 (BP Location: Left Arm, Patient Position: Sitting)   Pulse 65   Temp 98 F (36.7 C) (Oral)   Resp 16   Ht 5\' 4"  (1.626 m)   Wt 236 lb 3.2 oz (107.1 kg)   SpO2 99%   BMI 40.54 kg/m  General: Awake, appears stated age Heart: RRR, no LE edema Lungs: CTAB, no rales, wheezes or rhonchi. No accessory muscle use Psych: Age appropriate judgment and insight, normal affect and mood  Assessment and Plan: Grief  Renal transplant recipient  Iron  deficiency anemia due to chronic blood loss  I knew her husband and  he was a good man.  She is seeing a Veterinary surgeon at [employer] she attends.  She will let me know if anything changes where she wishes to pursue medication. Reviewed this thoroughly.  Appreciate ID, hematology. Seems to be significantly better.  Stay off of Eliquis for now.  Appreciate GI, hematology.  I cannot imagine if she is no longer having issues with A-fib that her cardiology team will want her back on anticoagulation. Health maintenance updated. Follow-up as needed for now. The patient voiced understanding and agreement to the plan.  I spent 32 minutes with the patient discussing the above plans in addition to reviewing her chart on the same day of the visit.  Shellie Dials [place], DO 10/16/23  3:43 PM

## 2023-10-18 ENCOUNTER — Encounter: Payer: Self-pay | Admitting: Gastroenterology

## 2023-10-18 NOTE — Progress Notes (Signed)
 Sandy Moore,  The biopsies taken from your stomach were notable for mild chronic gastritis (inflammation) which is a common finding, but there was no evidence of Helicobacter pylori infection. No specific treatment or further evaluation recommended. The polyp removed from the top of your stomach was a hyperplastic polyp.  There was no precancerous changes noted.  Hyperplastic polyps are generally considered reactive.  Given its location in the area of the hiatal hernia, suspect this polyp was related to mechanical irritation.  Gastric polyps can be a source of bleeding, and it is possible that your recent GI bleed was from this polyp.  This polyp has been removed.  For now, I would not recommend any further evaluation for GI bleeding.  Continue to monitor blood counts and look for symptoms of GI bleeding such as black tarry stools.  Consider repeat colonoscopy for colon cancer screening/polyp surveillance in 5 years

## 2023-10-29 ENCOUNTER — Encounter: Payer: Self-pay | Admitting: Family

## 2023-10-29 ENCOUNTER — Inpatient Hospital Stay: Admitting: Family

## 2023-10-29 ENCOUNTER — Inpatient Hospital Stay: Attending: Hematology & Oncology

## 2023-10-29 VITALS — BP 141/68 | HR 78 | Temp 98.0°F | Resp 19 | Ht 64.0 in | Wt 233.0 lb

## 2023-10-29 DIAGNOSIS — Z79899 Other long term (current) drug therapy: Secondary | ICD-10-CM | POA: Diagnosis not present

## 2023-10-29 DIAGNOSIS — D500 Iron deficiency anemia secondary to blood loss (chronic): Secondary | ICD-10-CM

## 2023-10-29 DIAGNOSIS — K922 Gastrointestinal hemorrhage, unspecified: Secondary | ICD-10-CM

## 2023-10-29 DIAGNOSIS — Z881 Allergy status to other antibiotic agents status: Secondary | ICD-10-CM | POA: Diagnosis not present

## 2023-10-29 DIAGNOSIS — R5383 Other fatigue: Secondary | ICD-10-CM | POA: Insufficient documentation

## 2023-10-29 DIAGNOSIS — K449 Diaphragmatic hernia without obstruction or gangrene: Secondary | ICD-10-CM | POA: Diagnosis not present

## 2023-10-29 DIAGNOSIS — Z91030 Bee allergy status: Secondary | ICD-10-CM | POA: Insufficient documentation

## 2023-10-29 DIAGNOSIS — M7989 Other specified soft tissue disorders: Secondary | ICD-10-CM | POA: Diagnosis not present

## 2023-10-29 LAB — CMP (CANCER CENTER ONLY)
ALT: 10 U/L (ref 0–44)
AST: 10 U/L — ABNORMAL LOW (ref 15–41)
Albumin: 4.3 g/dL (ref 3.5–5.0)
Alkaline Phosphatase: 92 U/L (ref 38–126)
Anion gap: 11 (ref 5–15)
BUN: 30 mg/dL — ABNORMAL HIGH (ref 8–23)
CO2: 25 mmol/L (ref 22–32)
Calcium: 9.5 mg/dL (ref 8.9–10.3)
Chloride: 106 mmol/L (ref 98–111)
Creatinine: 1.35 mg/dL — ABNORMAL HIGH (ref 0.44–1.00)
GFR, Estimated: 43 mL/min — ABNORMAL LOW (ref >60)
Glucose, Bld: 136 mg/dL — ABNORMAL HIGH (ref 70–99)
Potassium: 4.4 mmol/L (ref 3.5–5.1)
Sodium: 142 mmol/L (ref 135–145)
Total Bilirubin: 0.6 mg/dL (ref 0.0–1.2)
Total Protein: 6.7 g/dL (ref 6.5–8.1)

## 2023-10-29 LAB — CBC WITH DIFFERENTIAL (CANCER CENTER ONLY)
Abs Immature Granulocytes: 0.14 10*3/uL — ABNORMAL HIGH (ref 0.00–0.07)
Basophils Absolute: 0.1 10*3/uL (ref 0.0–0.1)
Basophils Relative: 1 %
Eosinophils Absolute: 0.2 10*3/uL (ref 0.0–0.5)
Eosinophils Relative: 2 %
HCT: 37.6 % (ref 36.0–46.0)
Hemoglobin: 11.7 g/dL — ABNORMAL LOW (ref 12.0–15.0)
Immature Granulocytes: 1 %
Lymphocytes Relative: 15 %
Lymphs Abs: 1.5 10*3/uL (ref 0.7–4.0)
MCH: 29.5 pg (ref 26.0–34.0)
MCHC: 31.1 g/dL (ref 30.0–36.0)
MCV: 94.7 fL (ref 80.0–100.0)
Monocytes Absolute: 0.5 10*3/uL (ref 0.1–1.0)
Monocytes Relative: 5 %
Neutro Abs: 7.6 10*3/uL (ref 1.7–7.7)
Neutrophils Relative %: 76 %
Platelet Count: 240 10*3/uL (ref 150–400)
RBC: 3.97 MIL/uL (ref 3.87–5.11)
RDW: 17.7 % — ABNORMAL HIGH (ref 11.5–15.5)
WBC Count: 10.1 10*3/uL (ref 4.0–10.5)
nRBC: 0 % (ref 0.0–0.2)

## 2023-10-29 LAB — IRON AND IRON BINDING CAPACITY (CC-WL,HP ONLY)
Iron: 55 ug/dL (ref 28–170)
Saturation Ratios: 22 % (ref 10.4–31.8)
TIBC: 252 ug/dL (ref 250–450)
UIBC: 197 ug/dL (ref 148–442)

## 2023-10-29 LAB — SAMPLE TO BLOOD BANK

## 2023-10-29 LAB — RETICULOCYTES
Immature Retic Fract: 9.6 % (ref 2.3–15.9)
RBC.: 3.9 MIL/uL (ref 3.87–5.11)
Retic Count, Absolute: 54.2 10*3/uL (ref 19.0–186.0)
Retic Ct Pct: 1.4 % (ref 0.4–3.1)

## 2023-10-29 LAB — FERRITIN: Ferritin: 326 ng/mL — ABNORMAL HIGH (ref 11–307)

## 2023-10-29 NOTE — Progress Notes (Signed)
 Hematology and Oncology Follow Up Visit  ITHZEL FEDORCHAK [id number] May 09, 1956 68 y.o. 10/29/2023   Principle Diagnosis:  Anemia secondary to GI bleed Iron  deficiency anemia Erythropoietin  deficiency anemia    Current Therapy:        IV iron  as indicated Transfusional support as needed   Interim History:  Ms. Garrod is here today for follow-up. She is feeling fatigued and has noted some episodes of dizziness which she feels may be related to orthostatic hypotension.  She had an EGD and colonoscopy 3 weeks ago. She had mild chronic gastritis, 1 gastric polyp removed (hyperplastic) and hiatal hernia. She was also noted to have mild diverticulitis.  She is still off her anticoagulation and will discuss restarting with EP and cardiology.  No fever, chills, n/v, cough, rash, SOB, chest pain, palpitations, abdominal pain or changes in bowel or bladder habits at this time.  Mild swelling in her lower extremities more on the left post surgery. Pedal pulses are 1+.  No falls or syncope reported.  Appetite and hydration are good. Weight is stable at 233 lbs.   ECOG Performance Status: 1 - Symptomatic but completely ambulatory  Medications:  Allergies as of 10/29/2023       Reactions   Bee Venom Anaphylaxis   Diovan [valsartan] Other (See Comments)   Pt said possible the reason she went into kidney failure.   Privigen [immune Globulin (human)] Other (See Comments)   Cause muscle contortions all over my body per Pt.   Erythromycin Rash        Medication List        Accurate as of Oct 29, 2023  1:14 PM. If you have any questions, ask your nurse or doctor.          acetaminophen  500 MG tablet Commonly known as: TYLENOL  Take by mouth.   atorvastatin 40 MG tablet Commonly known as: LIPITOR Take 40 mg by mouth daily.   cyanocobalamin  1000 MCG tablet Take 1,000 mcg by mouth every other day.   cycloSPORINE  modified 25 MG capsule Commonly known as: NEORAL  Take 150 mg  by mouth 2 (two) times daily.   EPINEPHrine  0.3 mg/0.3 mL Soaj injection Commonly known as: EPI-PEN Inject 0.3 mLs (0.3 mg total) into the muscle as needed for anaphylaxis.   estradiol 2 MG tablet Commonly known as: ESTRACE Take 1 tablet by mouth daily.   folic acid 1 MG tablet Commonly known as: FOLVITE Take 1 mg by mouth daily.   furosemide  40 MG tablet Commonly known as: LASIX  Take 40 mg by mouth as needed.   Iron  (Ferrous Sulfate ) 325 (65 Fe) MG Tabs Take 325 mg by mouth daily.   levothyroxine 112 MCG tablet Commonly known as: SYNTHROID Take 112 mcg by mouth daily before breakfast.   losartan 50 MG tablet Commonly known as: COZAAR Take 50 mg by mouth 2 (two) times daily.   MAG-OXIDE PO Take 266 mg by mouth 2 (two) times daily.   multivitamin-lutein Caps capsule Take 1 capsule by mouth daily.   mycophenolate 360 MG Tbec EC tablet Commonly known as: MYFORTIC mycophenolate sodium 360 mg tablet,delayed release   omeprazole 20 MG capsule Commonly known as: PRILOSEC Take 20 mg by mouth 2 (two) times daily before a meal.   ondansetron 4 MG tablet Commonly known as: ZOFRAN Take 4 mg by mouth every 8 (eight) hours as needed for nausea or vomiting.   OVER THE COUNTER MEDICATION CYA Target   predniSONE  5 MG tablet Commonly known as:  DELTASONE  Take 5 mg by mouth daily with breakfast.   Saxenda 18 MG/3ML Sopn Generic drug: Liraglutide -Weight Management daily.   sodium bicarbonate 650 MG tablet Take 1,300 mg by mouth 3 (three) times daily.   sulfamethoxazole-trimethoprim 400-80 MG tablet Commonly known as: BACTRIM Take 1 tablet by mouth 3 (three) times a week.   traMADol 50 MG tablet Commonly known as: ULTRAM Take 1 tablet by mouth every 4 (four) hours as needed.        Allergies:  Allergies  Allergen Reactions   Bee Venom Anaphylaxis   Diovan [Valsartan] Other (See Comments)    Pt said possible the reason she went into kidney failure.   Privigen  [Immune Globulin (Human)] Other (See Comments)    Cause muscle contortions all over my body per Pt.   Erythromycin Rash    Past Medical History, Surgical history, Social history, and Family History were reviewed and updated.  Review of Systems: All other 10 point review of systems is negative.   Physical Exam:  height is 5\' 4"  (1.626 m) and weight is 233 lb (105.7 kg). Her oral temperature is 98 F (36.7 C). Her blood pressure is 141/68 (abnormal) and her pulse is 78. Her respiration is 19 and oxygen saturation is 100%.   Wt Readings from Last 3 Encounters:  10/29/23 233 lb (105.7 kg)  10/16/23 236 lb 3.2 oz (107.1 kg)  10/09/23 230 lb (104.3 kg)    Ocular: Sclerae unicteric, pupils equal, round and reactive to light Ear-nose-throat: Oropharynx clear, dentition fair Lymphatic: No cervical or supraclavicular adenopathy Lungs no rales or rhonchi, good excursion bilaterally Heart regular rate and rhythm, no murmur appreciated Abd soft, nontender, positive bowel sounds MSK no focal spinal tenderness, no joint edema Neuro: non-focal, well-oriented, appropriate affect Breasts: Deferred   Lab Results  Component Value Date   WBC 10.1 10/29/2023   HGB 11.7 (L) 10/29/2023   HCT 37.6 10/29/2023   MCV 94.7 10/29/2023   PLT 240 10/29/2023   Lab Results  Component Value Date   FERRITIN 259 09/28/2023   IRON  65 09/28/2023   TIBC 272 09/28/2023   UIBC 207 09/28/2023   IRONPCTSAT 24 09/28/2023   Lab Results  Component Value Date   RETICCTPCT 1.4 10/29/2023   RBC 3.90 10/29/2023   RETICCTABS 51.4 07/15/2009   No results found for: "KPAFRELGTCHN", "LAMBDASER", "KAPLAMBRATIO" No results found for: "IGGSERUM", "IGA", "IGMSERUM" No results found for: "TOTALPROTELP", "ALBUMINELP", "A1GS", "A2GS", "BETS", "BETA2SER", "GAMS", "MSPIKE", "SPEI"   Chemistry      Component Value Date/Time   NA 141 09/28/2023 0938   NA 142 01/12/2010 0815   K 4.4 09/28/2023 0938   K 4.7 01/12/2010 0815    CL 109 09/28/2023 0938   CL 104 01/12/2010 0815   CO2 24 09/28/2023 0938   CO2 26 01/12/2010 0815   BUN 26 (H) 09/28/2023 0938   BUN 29 (H) 01/12/2010 0815   CREATININE 1.22 (H) 09/28/2023 0938   CREATININE 3.0 (H) 01/12/2010 0815      Component Value Date/Time   CALCIUM 9.5 09/28/2023 0938   CALCIUM 10.2 01/12/2010 0815   ALKPHOS 83 09/28/2023 0938   ALKPHOS 109 (H) 01/12/2010 0815   AST 10 (L) 09/28/2023 0938   ALT 9 09/28/2023 0938   ALT 15 01/12/2010 0815   BILITOT 0.5 09/28/2023 0938       Impression and Plan: Ms. Gerety is a very pleasant 68 yo caucasian female with anemia secondary to GI blood loss.  Iron   studies are pending. We will replace if needed.  Hgb is stable at 11.7! Follow-up in 6 weeks.   Kennard Pea, NP 5/19/20251:14 PM

## 2023-11-21 ENCOUNTER — Ambulatory Visit: Attending: Family Medicine | Admitting: Physical Therapy

## 2023-11-21 ENCOUNTER — Other Ambulatory Visit: Payer: Self-pay

## 2023-11-21 ENCOUNTER — Encounter: Payer: Self-pay | Admitting: Physical Therapy

## 2023-11-21 DIAGNOSIS — M25561 Pain in right knee: Secondary | ICD-10-CM | POA: Diagnosis present

## 2023-11-21 DIAGNOSIS — R262 Difficulty in walking, not elsewhere classified: Secondary | ICD-10-CM | POA: Diagnosis not present

## 2023-11-21 NOTE — Therapy (Signed)
 OUTPATIENT PHYSICAL THERAPY LOWER EXTREMITY EVALUATION   Patient Name: Sandy Moore MRN: [id number] DOB:12-03-1955, 68 y.o., female Today's Date: 11/21/2023  END OF SESSION:  PT End of Session - 11/21/23 1252     Visit Number 1    Number of Visits 16    Date for PT Re-Evaluation 01/16/24    Authorization Type Medicare    Progress Note Due on Visit 10    PT Start Time 1015    PT Stop Time 1057    PT Time Calculation (min) 42 min    Activity Tolerance Patient tolerated treatment well    Behavior During Therapy WFL for tasks assessed/performed             Past Medical History:  Diagnosis Date   Allergy    Anemia    Blood transfusion without reported diagnosis    End stage renal disease (HCC)    GERD (gastroesophageal reflux disease)    HTN (hypertension)    Hyperlipidemia    Hypothyroidism    Osteopenia    Prolapsed internal hemorrhoids, grade 2 12/08/2016   Status post dilation of esophageal narrowing    Past Surgical History:  Procedure Laterality Date   CESAREAN SECTION  1980, 1984   COLONOSCOPY     EXPLORATORY LAPAROTOMY  1983   HEMORRHOID BANDING     hysterectomy     KIDNEY TRANSPLANT  2013   Left ankle fracture repair     POLYPECTOMY     UPPER GASTROINTESTINAL ENDOSCOPY     Patient Active Problem List   Diagnosis Date Noted   Renal transplant recipient 10/16/2023   End stage renal disease (HCC)    IDA (iron  deficiency anemia) 09/11/2023   Prolapsed internal hemorrhoids, grade 2 12/08/2016   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 MD put patellar sublux back into place but she  continues with Rt knee pain. She has been using [employer] for ambulation since she fell. She [facility] brace [facility] but states that the brace slides down when she walks. She states pain increases the most with wt bearing, walking and standing. She has difficulty getting into the shower (tub shower) and with stair negotiation. Pain decreases with meds, ice.  PERTINENT HISTORY: Recently lost her husband, Kidney transplant, Afib PAIN:  Are you having pain? Yes: NPRS scale: 4/10 currently, 8/10 at worst Pain location: Rt knee Pain description: sore, sharp Aggravating factors: standing, walking Relieving factors: ice, meds  PRECAUTIONS: None  RED FLAGS: None   WEIGHT BEARING RESTRICTIONS: No  FALLS:  Has patient fallen in last 6 months? Yes. Number of falls 1  LIVING ENVIRONMENT: Lives with: lives alone Lives in: House/apartment Stairs: 17 steps to second floor for bedroom/bath Has following equipment at home: Single point cane  OCCUPATION: retired Engineer, civil (consulting) - recently lost her husband and is adjusting to living alone - water aerobics at the [employer]  PLOF: Independent  PATIENT GOALS: not dislocate my knee again    OBJECTIVE:  Note: Objective measures were completed at Evaluation unless otherwise noted.   PATIENT SURVEYS:  Patient-specific activity scoring scheme (Point to one number):  0 represents "unable to  perform." 10 represents "able to perform at prior level. 0 1 2 3 4 5 6 7 8 9  10 (Date and Score) Activity Initial  Activity Eval     stairs 3     Ambulate in house 4     In/out tub shower 2    Additional Additional Total score = sum of the activity scores/number of activities Minimum detectable change (90%CI) for average score = 2 points Minimum detectable change (90%CI) for single activity score = 3 points PSFS developed by: [place] Spitz., & Binkley, J. (1995). Assessing disability and change on individual  patients: a report of a  patient specific measure. Physiotherapy [place], [phone], [phone]. Reproduced with the permission of the authors  Score: Total = 9 Average = 3      SENSATION: WFL  EDEMA:  Mild edema Rt knee   PALPATION: TTP around Rt patella Unable to assess patellar mobility due to pain  LOWER EXTREMITY ROM:  Active ROM Right eval Left eval  Hip flexion    Hip extension    Hip abduction    Hip adduction    Hip internal rotation    Hip external rotation    Knee flexion 118 135  Knee extension 0 0  Ankle dorsiflexion    Ankle plantarflexion    Ankle inversion    Ankle eversion     (Blank rows = not tested)  LOWER EXTREMITY MMT:  MMT Right eval Left eval  Hip flexion 3 4  Hip extension    Hip abduction 3+ 4  Hip adduction    Hip internal rotation    Hip external rotation    Knee flexion 3   Knee extension 3+   Ankle dorsiflexion    Ankle plantarflexion    Ankle inversion    Ankle eversion     (Blank rows = not tested)    FUNCTIONAL TESTS:  2= 30 seconds chair stand test Timed up and go (TUG): 21.35 seconds   GAIT: Distance walked: 100' Assistive device utilized: Single point cane Level of assistance: Modified independence Comments: wide BOS, decreased Rt knee flexion, circumduction Rt hip                                                                                                                                TREATMENT DATE: 11/21/23 PT educated pt on PT POC and goals, HEP, rationale for treatment Gait with brace with attempting to adjust brace to prevent it sliding down. Brace continues to slide despite adjustments. PT advised pt to use ace wrap as pt states this works better for her at home    PATIENT EDUCATION:  Education details: PT POC and goals, HEP Person educated: Patient Education method: Explanation, Demonstration, and Handouts Education comprehension: verbalized understanding and returned demonstration  HOME EXERCISE PROGRAM: Access Code:  [email or web address] URL: [email or web address] Date: 11/21/2023 Prepared by: Lowery Rue  Exercises - Side Stepping  - 1 x  daily - 7 x weekly - 3 sets - 10 reps - Backward Walking  - 1 x daily - 7 x weekly - 3 sets - 10 reps - Seated Knee Flexion Extension AROM   - 3 x daily - 7 x weekly - 3 sets - 10 reps  ASSESSMENT:  CLINICAL IMPRESSION: Patient is a 68 y.o. female who was seen today for physical therapy evaluation and treatment for Rt knee pain. She presents with impaired gait and balance, decreased strength, ROM and mobility. She will benefit from skilled PT to address deficits and improve functional mobility with decreased pain.   OBJECTIVE IMPAIRMENTS: Abnormal gait, decreased activity tolerance, decreased balance, difficulty walking, decreased ROM, decreased strength, and pain.   ACTIVITY LIMITATIONS: standing, squatting, stairs, bathing, and locomotion level  PARTICIPATION LIMITATIONS: meal prep, cleaning, driving, shopping, and community activity  PERSONAL FACTORS: Time since onset of injury/illness/exacerbation and 1-2 comorbidities: kidney transplant, history of ankle fracture are also affecting patient's functional outcome.   REHAB POTENTIAL: Good  CLINICAL DECISION MAKING: Evolving/moderate complexity  EVALUATION COMPLEXITY: Moderate   GOALS: Goals reviewed with patient? Yes  SHORT TERM GOALS: Target date: 12/19/2023   Pt will be independent in initial HEP Baseline: Goal status: INITIAL  2.  Pt will improve 30 second sit <> stand to >= 5 to demo improved strength Baseline:  Goal status: INITIAL   LONG TERM GOALS: Target date: 01/16/2024    Pt will be independent with advanced HEP Baseline:  Goal status: INITIAL  2.  Pt will improve PSFS average score to >= 5 to demo improved functional mobility Baseline:  Goal status: INITIAL  3.  Pt will negotiate flight of stairs with reciprocal pattern with pain <= 3/10 Baseline:  Goal status:  INITIAL  4.  Pt will improve TUG to <= 16 seconds to demo improved gait Baseline:  Goal status: INITIAL     PLAN:  PT FREQUENCY: 2x/week  PT DURATION: 8 weeks  PLANNED INTERVENTIONS: 97164- PT Re-evaluation, 97110-Therapeutic exercises, 97530- Therapeutic activity, 97112- Neuromuscular re-education, 97535- Self Care, 16109- Manual therapy, [id number]- Aquatic Therapy, U0454- Electrical stimulation (unattended), 97016- Vasopneumatic device, [id number]- Ionotophoresis 4mg /ml Dexamethasone, Patient/Family education, Balance training, Taping, Cryotherapy, and Moist heat  PLAN FOR NEXT SESSION: assess response to HEP, knee mobility and strength, gait, stairs   Dezirae Service, PT 11/21/2023, 12:53 PM

## 2023-11-28 ENCOUNTER — Ambulatory Visit

## 2023-11-28 DIAGNOSIS — R262 Difficulty in walking, not elsewhere classified: Secondary | ICD-10-CM

## 2023-11-28 DIAGNOSIS — M25561 Pain in right knee: Secondary | ICD-10-CM | POA: Diagnosis not present

## 2023-11-28 NOTE — Therapy (Signed)
 OUTPATIENT PHYSICAL THERAPY LOWER EXTREMITY TREATMENT   Patient Name: Sandy Moore MRN: [id number] DOB:[birth year], 68 y.o., female Today's Date: 11/28/2023  END OF SESSION:  PT End of Session - 11/28/23 0933     Visit Number 2    Number of Visits 16    Date for PT Re-Evaluation 01/16/24    Authorization Type Medicare    Progress Note Due on Visit 10    PT Start Time 0935    PT Stop Time 1020    PT Time Calculation (min) 45 min    Activity Tolerance Patient tolerated treatment well    Behavior During Therapy WFL for tasks assessed/performed         Past Medical History:  Diagnosis Date   Allergy    Anemia    Blood transfusion without reported diagnosis    End stage renal disease (HCC)    GERD (gastroesophageal reflux disease)    HTN (hypertension)    Hyperlipidemia    Hypothyroidism    Osteopenia    Prolapsed internal hemorrhoids, grade 2 12/08/2016   Status post dilation of esophageal narrowing    Past Surgical History:  Procedure Laterality Date   CESAREAN SECTION  1980, 1984   COLONOSCOPY     EXPLORATORY LAPAROTOMY  1983   HEMORRHOID BANDING     hysterectomy     KIDNEY TRANSPLANT  2013   Left ankle fracture repair     POLYPECTOMY     UPPER GASTROINTESTINAL ENDOSCOPY     Patient Active Problem List   Diagnosis Date Noted   Renal transplant recipient 10/16/2023   End stage renal disease (HCC)    IDA (iron  deficiency anemia) 09/11/2023   Prolapsed internal hemorrhoids, grade 2 12/08/2016   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   EVAL: Pt fell up the stairs 11/08/23 and had a Rt patellar subluxation. MD put patellar sublux back into place but she continues with Rt knee pain. She has been using [facility] for ambulation since she fell. [facility] but states that the brace slides down when she walks. She states pain increases the most with wt bearing, walking and standing. She has difficulty getting into the shower (tub shower) and with stair negotiation. Pain decreases with meds, ice.  PERTINENT HISTORY: Recently lost her husband, Kidney transplant, Afib PAIN:  Are you having pain? Yes: NPRS scale: 4/10 currently, 8/10 at worst Pain location: Rt knee Pain description: sore, sharp Aggravating factors: standing, walking Relieving factors: ice, meds  PRECAUTIONS: None  RED FLAGS: None   WEIGHT BEARING RESTRICTIONS: No  FALLS:  Has patient fallen in last 6 months? Yes. 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   PATIENT SURVEYS:  Patient-specific activity scoring scheme (Point to one number):  0 represents "unable to perform." 10 represents "able to perform at prior level. 0 1 2 3 4 5 6 7 8 9  10 (Date and Score) Activity Initial  Activity Eval     stairs 3     Ambulate in house 4     In/out tub shower 2    Additional Additional Total score = sum of the activity scores/number of activities Minimum detectable change (90%CI) for average score = 2 points Minimum detectable change (90%CI) for single activity score = 3  points PSFS developed by: [place] Spitz., & Binkley, J. (1995). Assessing disability and change on individual  patients: a report of a patient specific measure. Physiotherapy [place], 47, [phone]. Reproduced with the permission of the authors  Score: Total = 9 Average = 3    SENSATION: WFL  EDEMA:  Mild edema Rt knee   PALPATION: TTP around Rt patella Unable to assess patellar mobility due to pain  LOWER EXTREMITY ROM:  Active ROM Right eval Left eval  Hip flexion    Hip extension    Hip abduction    Hip adduction    Hip internal rotation    Hip external rotation    Knee flexion 118 135  Knee extension 0 0  Ankle dorsiflexion    Ankle plantarflexion    Ankle inversion    Ankle eversion     (Blank rows = not tested)  LOWER EXTREMITY MMT:  MMT Right eval Left eval  Hip flexion 3 4  Hip extension    Hip abduction 3+ 4  Hip adduction    Hip internal rotation    Hip external rotation    Knee flexion 3   Knee extension 3+   Ankle dorsiflexion    Ankle plantarflexion    Ankle inversion    Ankle eversion     (Blank rows = not tested)    FUNCTIONAL TESTS:  2= 30 seconds chair stand test Timed up and go (TUG): 21.35 seconds   GAIT: Distance walked: 100' Assistive device utilized: Single point cane Level of assistance: Modified independence Comments: wide BOS, decreased Rt knee flexion, circumduction Rt hip   [facility] Adult PT Treatment:                                                DATE: 11/28/2023 Therapeutic Exercise: Seated knee flexion AROM with slider  Supine quad set x4 Small range SLR x6 Therapeutic Activity: Applying ace bandage to knee --> instruction for patient on how to apply at home Gait: Gait training with [facility]  TREATMENT DATE: 11/21/23 PT educated pt on PT POC and goals, HEP, rationale for  treatment Gait with brace with attempting to adjust brace to prevent it sliding down. Brace continues to slide despite adjustments. PT advised pt to use ace wrap as pt states this works better for her at home    PATIENT EDUCATION:  Education details: PT POC and goals, HEP Person educated: Patient Education method: Explanation, Demonstration, and Handouts Education comprehension: verbalized understanding and returned demonstration  HOME EXERCISE PROGRAM: Access Code: [email or web address] URL: [email or web address] Date: 11/21/2023 Prepared by: Lowery Rue  Exercises - Side Stepping  - 1 x daily - 7 x weekly - 3 sets - 10 reps - Backward Walking  - 1 x daily - 7 x weekly - 3 sets - 10 reps - Seated Knee Flexion Extension AROM   - 3 x daily - 7 x weekly - 3 sets - 10 reps  ASSESSMENT:  CLINICAL IMPRESSION: Gait training performed with SPC, curing proper placement of AD when walking. Knee brace is not secure when walking, causing patient to decrease knee flexion during swing phase to keep brace in place; gait improved with transition to ace bandage wrap. Patient did not tolerate quad set; better tolerance with straight leg raise to progress quad strengthening.   EVAL: Patient is a 68 y.o. female who was seen today for physical therapy evaluation and treatment for Rt knee pain. She presents with impaired gait and balance, decreased strength, ROM and mobility. She will benefit from skilled PT to address deficits and improve functional mobility with decreased pain.   OBJECTIVE IMPAIRMENTS: Abnormal gait, decreased activity tolerance, decreased balance, difficulty walking, decreased ROM, decreased strength, and pain.   ACTIVITY LIMITATIONS: standing, squatting, stairs, bathing, and locomotion level  PARTICIPATION LIMITATIONS: meal prep, cleaning, driving, shopping, and community activity  PERSONAL FACTORS: Time since onset of injury/illness/exacerbation and 1-2 comorbidities: kidney  transplant, history of ankle fracture are also affecting patient's functional outcome.   REHAB POTENTIAL: Good  CLINICAL DECISION MAKING: Evolving/moderate complexity  EVALUATION COMPLEXITY: Moderate   GOALS: Goals reviewed with patient? Yes  SHORT TERM GOALS: Target date: 12/19/2023  Pt will be independent in initial HEP Baseline: Goal status: INITIAL  2.  Pt will improve 30 second sit <> stand to >= 5 to demo improved strength Baseline:  Goal status: INITIAL   LONG TERM GOALS: Target date: 01/16/2024  Pt will be independent with advanced HEP Baseline:  Goal status: INITIAL  2.  Pt will improve PSFS average score to >= 5 to demo improved functional mobility Baseline:  Goal status: INITIAL  3.  Pt will negotiate flight of stairs with reciprocal pattern with pain <= 3/10 Baseline:  Goal status: INITIAL  4.  Pt will improve TUG to <= 16 seconds to demo improved gait Baseline:  Goal status: INITIAL    PLAN:  PT FREQUENCY: 2x/week  PT DURATION: 8 weeks  PLANNED INTERVENTIONS: 97164- PT Re-evaluation, 97110-Therapeutic exercises, 97530- Therapeutic activity, 97112- Neuromuscular re-education, 97535- Self Care, 54098- Manual therapy, [id number]- Aquatic Therapy, J1914- Electrical stimulation (unattended), 97016- Vasopneumatic device, [id number]- Ionotophoresis 4mg /ml Dexamethasone, Patient/Family education, Balance training, Taping, Cryotherapy, and Moist heat  PLAN FOR NEXT SESSION: Update HEP, knee mobility and strength, continue gait training with SPC, stairs   Flint Hummer, PTA 11/28/2023, 11:27 AM

## 2023-11-30 ENCOUNTER — Ambulatory Visit

## 2023-11-30 DIAGNOSIS — R262 Difficulty in walking, not elsewhere classified: Secondary | ICD-10-CM

## 2023-11-30 DIAGNOSIS — M25561 Pain in right knee: Secondary | ICD-10-CM | POA: Diagnosis not present

## 2023-11-30 NOTE — Therapy (Signed)
 OUTPATIENT PHYSICAL THERAPY LOWER EXTREMITY TREATMENT   Patient Name: Sandy Moore MRN: [id number] DOB:[birth year], 68 y.o., female Today's Date: 11/30/2023  END OF SESSION:  PT End of Session - 11/30/23 0848     Visit Number 3    Number of Visits 16    Date for PT Re-Evaluation 01/16/24    Authorization Type Medicare    Progress Note Due on Visit 10    PT Start Time 0848    PT Stop Time 0926    PT Time Calculation (min) 38 min    Activity Tolerance Patient tolerated treatment well    Behavior During Therapy WFL for tasks assessed/performed          Past Medical History:  Diagnosis Date   Allergy    Anemia    Blood transfusion without reported diagnosis    End stage renal disease (HCC)    GERD (gastroesophageal reflux disease)    HTN (hypertension)    Hyperlipidemia    Hypothyroidism    Osteopenia    Prolapsed internal hemorrhoids, grade 2 12/08/2016   Status post dilation of esophageal narrowing    Past Surgical History:  Procedure Laterality Date   CESAREAN SECTION  1980, 1984   COLONOSCOPY     EXPLORATORY LAPAROTOMY  1983   HEMORRHOID BANDING     hysterectomy     KIDNEY TRANSPLANT  2013   Left ankle fracture repair     POLYPECTOMY     UPPER GASTROINTESTINAL ENDOSCOPY     Patient Active Problem List   Diagnosis Date Noted   Renal transplant recipient 10/16/2023   End stage renal disease (HCC)    IDA (iron  deficiency anemia) 09/11/2023   Prolapsed internal hemorrhoids, grade 2 12/08/2016   Anemia of renal disease 07/31/2011    PCP: Sylvia Everts  REFERRING PROVIDER: Lacinda Pica  REFERRING DIAG: Rt knee pain  THERAPY DIAG:  Acute pain of right knee  Difficulty in walking, not elsewhere classified  Rationale for Evaluation and Treatment: Rehabilitation  ONSET DATE: 11/08/23  SUBJECTIVE:   SUBJECTIVE STATEMENT: I've been hurting so bad.   EVAL: Pt fell up the stairs 11/08/23 and had a Rt patellar subluxation. MD put patellar  sublux back into place but she continues with Rt knee pain. She has been using [facility] for ambulation since she fell. [facility] at [facility] but states that the brace slides down when she walks. She states pain increases the most with wt bearing, walking and standing. She has difficulty getting into the shower (tub shower) and with stair negotiation. Pain decreases with meds, ice.  PERTINENT HISTORY: Recently lost her husband, Kidney transplant, Afib PAIN:  Are you having pain? Yes: NPRS scale: 8 Pain location: Rt knee Pain description: sore, sharp Aggravating factors: standing, walking Relieving factors: ice, meds  PRECAUTIONS: None  RED FLAGS: None   WEIGHT BEARING RESTRICTIONS: No  FALLS:  Has patient fallen in last 6 months? Yes. 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   PATIENT SURVEYS:  Patient-specific activity scoring scheme (Point to one number):  0 represents "unable to  perform." 10 represents "able to perform at prior level. 0 1 2 3 4 5 6 7 8 9  10 (Date and Score) Activity Initial  Activity Eval     stairs 3     Ambulate in house 4     In/out tub shower 2    Additional Additional Total score = sum of the activity scores/number of activities Minimum detectable change (90%CI) for average score = 2 points Minimum detectable change (90%CI) for single activity score = 3 points PSFS developed by: [place] Spitz., & Binkley, J. (1995). Assessing disability and change on individual  patients: a report of a  patient specific measure. Physiotherapy [place], 47, [phone]. Reproduced with the permission of the authors  Score: Total = 9 Average = 3    SENSATION: WFL  EDEMA:  Mild edema Rt knee   PALPATION: TTP around Rt patella Unable to assess patellar mobility due to pain  LOWER EXTREMITY ROM:  Active ROM Right eval Left eval  Hip flexion    Hip extension    Hip abduction    Hip adduction    Hip internal rotation    Hip external rotation    Knee flexion 118 135  Knee extension 0 0  Ankle dorsiflexion    Ankle plantarflexion    Ankle inversion    Ankle eversion     (Blank rows = not tested)  LOWER EXTREMITY MMT:  MMT Right eval Left eval  Hip flexion 3 4  Hip extension    Hip abduction 3+ 4  Hip adduction    Hip internal rotation    Hip external rotation    Knee flexion 3   Knee extension 3+   Ankle dorsiflexion    Ankle plantarflexion    Ankle inversion    Ankle eversion     (Blank rows = not tested)    FUNCTIONAL TESTS:  2= 30 seconds chair stand test Timed up and go (TUG): 21.35 seconds   GAIT: Distance walked: 100' Assistive device utilized: Single point cane Level of assistance: Modified independence Comments: wide BOS, decreased Rt knee flexion, circumduction Rt hip [facility] Adult PT Treatment:                                                DATE: 11/30/23 Therapeutic Exercise: Ankle pumps x 10  Seated HS stretch x 30 sec  Seated heel slides on towel x 10  Seated hip adduction pillow squeeze x 10  Seated resisted march green band x 10   Neuromuscular re-ed: LAQ x 10  Quad set x 5  Seated hip abduction green band x 10   Self Care: Ice,elevation, compression, ankle pumps for pain/swelling Anatomy of the knee Recommended to f/u with referring provider regarding anti-inflammatory medication    [facility] Adult PT Treatment:                                                DATE: 11/28/2023 Therapeutic Exercise: Seated knee flexion AROM with slider   Supine quad set x4 Small range SLR x6 Therapeutic Activity: Applying ace bandage to knee --> instruction for patient on how to apply at home Gait: Gait training with [facility]  TREATMENT DATE: 11/21/23 PT educated pt on PT POC and goals, HEP, rationale for treatment Gait with brace with attempting to adjust brace to prevent it sliding down. Brace continues to slide despite adjustments. PT advised pt to use ace wrap as pt states this works better for her at home    PATIENT EDUCATION:  Education details: see treatment; HEP update  Person educated: Patient Education method: Explanation, Demonstration, and Handouts Education comprehension: verbalized understanding and returned demonstration  HOME EXERCISE PROGRAM: Access Code: [email or web address] URL: [email or web address] Date: 11/30/2023 Prepared by: Forrestine Ike  Exercises - Side Stepping  - 1 x daily - 7 x weekly - 3 sets - 10 reps - Backward Walking  - 1 x daily - 7 x weekly - 3 sets - 10 reps - Seated Knee Flexion Extension AROM   - 3 x daily - 7 x weekly - 3 sets - 10 reps - Long Sitting Quad Set  - 1 x daily - 7 x weekly - 1 sets - 10 reps - Seated Long Arc Quad  - 1 x daily - 7 x weekly - 1 sets - 10 reps - Seated Ankle Pumps  - 1 x daily - 7 x weekly - 1 sets - 10 reps - Seated Hip Abduction with Resistance  - 1 x daily - 7 x weekly - 1 sets - 10 reps - Seated March with Resistance  - 1 x daily - 7 x weekly - 1 sets - 10 reps - Seated Hip Adduction Isometrics with Ball  - 1 x daily - 7 x weekly - 1 sets - 10 reps  ASSESSMENT:  CLINICAL IMPRESSION: Patient arrives reporting significant Rt knee pain. Fair tolerance to gentle mobility and strengthening exercises today. We discussed frequent bouts of short mobility and strengthening work throughout her day to assist in reducing pain and improving  tolerance to walking/standing activity with patient verbalizing understanding. She continues to have moderate amount of swelling about the knee and was recommended to reach out to referring provider regarding appropriate anti-inflammatory medication given history of kidney transplant with patient verbalizing understanding.   EVAL: Patient is a 68 y.o. female who was seen today for physical therapy evaluation and treatment for Rt knee pain. She presents with impaired gait and balance, decreased strength, ROM and mobility. She will benefit from skilled PT to address deficits and improve functional mobility with decreased pain.   OBJECTIVE IMPAIRMENTS: Abnormal gait, decreased activity tolerance, decreased balance, difficulty walking, decreased ROM, decreased strength, and pain.   ACTIVITY LIMITATIONS: standing, squatting, stairs, bathing, and locomotion level  PARTICIPATION LIMITATIONS: meal prep, cleaning, driving, shopping, and community activity  PERSONAL FACTORS: Time since onset of injury/illness/exacerbation and 1-2 comorbidities: kidney transplant, history of ankle fracture are also affecting patient's functional outcome.   REHAB POTENTIAL: Good  CLINICAL DECISION MAKING: Evolving/moderate complexity  EVALUATION COMPLEXITY: Moderate   GOALS: Goals reviewed with patient? Yes  SHORT TERM GOALS: Target date: 12/19/2023  Pt will be independent in initial HEP Baseline: Goal status: INITIAL  2.  Pt will improve 30 second sit <> stand to >= 5 to demo improved strength Baseline:  Goal status: INITIAL   LONG TERM GOALS: Target date: 01/16/2024  Pt will be independent with advanced HEP Baseline:  Goal status: INITIAL  2.  Pt will improve PSFS average score to >= 5 to demo improved functional mobility Baseline:  Goal status: INITIAL  3.  Pt will negotiate flight of stairs with reciprocal pattern with pain <= 3/10 Baseline:  Goal status: INITIAL  4.  Pt will improve TUG to <= 16  seconds to demo improved gait Baseline:  Goal status: INITIAL    PLAN:  PT FREQUENCY: 2x/week  PT DURATION: 8 weeks  PLANNED INTERVENTIONS: 97164- PT Re-evaluation, 97110-Therapeutic exercises, 97530- Therapeutic activity, 97112- Neuromuscular re-education, 97535- Self Care, 16109- Manual therapy, [id number]- Aquatic Therapy, [id number]- Electrical stimulation (unattended), 97016- Vasopneumatic device, [id number]- Ionotophoresis 4mg /ml Dexamethasone, Patient/Family education, Balance training, Taping, Cryotherapy, and Moist heat  PLAN FOR NEXT SESSION: Update HEP, knee mobility and strength, continue gait training with SPC, stairs  Forrestine Ike, PT, DPT, ATC 11/30/23 9:31 AM

## 2023-12-03 ENCOUNTER — Ambulatory Visit

## 2023-12-03 DIAGNOSIS — M25561 Pain in right knee: Secondary | ICD-10-CM

## 2023-12-03 DIAGNOSIS — R262 Difficulty in walking, not elsewhere classified: Secondary | ICD-10-CM

## 2023-12-03 NOTE — Therapy (Signed)
 OUTPATIENT PHYSICAL THERAPY LOWER EXTREMITY TREATMENT   Patient Name: KALIANA ALBINO MRN: [id number] DOB:[birth year], 68 y.o., female Today's Date: 12/03/2023  END OF SESSION:  PT End of Session - 12/03/23 1106     Visit Number 4    Number of Visits 16    Date for PT Re-Evaluation 01/16/24    Authorization Type Medicare    Progress Note Due on Visit 10    PT Start Time 1105    PT Stop Time 1148    PT Time Calculation (min) 43 min    Activity Tolerance Patient tolerated treatment well    Behavior During Therapy WFL for tasks assessed/performed          Past Medical History:  Diagnosis Date   Allergy    Anemia    Blood transfusion without reported diagnosis    End stage renal disease (HCC)    GERD (gastroesophageal reflux disease)    HTN (hypertension)    Hyperlipidemia    Hypothyroidism    Osteopenia    Prolapsed internal hemorrhoids, grade 2 12/08/2016   Status post dilation of esophageal narrowing    Past Surgical History:  Procedure Laterality Date   CESAREAN SECTION  1980, 1984   COLONOSCOPY     EXPLORATORY LAPAROTOMY  1983   HEMORRHOID BANDING     hysterectomy     KIDNEY TRANSPLANT  2013   Left ankle fracture repair     POLYPECTOMY     UPPER GASTROINTESTINAL ENDOSCOPY     Patient Active Problem List   Diagnosis Date Noted   Renal transplant recipient 10/16/2023   End stage renal disease (HCC)    IDA (iron  deficiency anemia) 09/11/2023   Prolapsed internal hemorrhoids, grade 2 12/08/2016   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   EVAL: Pt fell up the stairs 11/08/23 and had a Rt patellar subluxation. MD put patellar sublux back into place but she continues with Rt knee pain. She has been using [facility] for ambulation since she fell. [facility] but states that the brace slides down when she walks. She states pain increases the most with wt bearing, walking and standing. She has difficulty getting into the shower (tub shower) and with stair negotiation. Pain decreases with meds, ice.  PERTINENT HISTORY: Recently lost her husband, Kidney transplant, Afib PAIN:  Are you having pain? Yes: NPRS scale: 8 Pain location: Rt knee Pain description: sore, sharp Aggravating factors: standing, walking Relieving factors: ice, meds  PRECAUTIONS: None  RED FLAGS: None   WEIGHT BEARING RESTRICTIONS: No  FALLS:  Has patient fallen in last 6 months? Yes. 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  PATIENT SURVEYS:  Patient-specific activity scoring scheme (Point to one number):  0 represents "unable to perform." 10 represents "able to perform at prior level. 0 1 2 3 4 5 6 7 8 9  10 (Date and Score) Activity Initial  Activity Eval     stairs 3     Ambulate in house 4     In/out tub shower 2    Additional Additional Total score = sum of the activity scores/number of activities Minimum detectable change (90%CI) for average score = 2 points Minimum detectable change (90%CI) for single activity score = 3 points PSFS developed by: Rosalee MYRTIS Marvis KYM Charlet CHRISTELLA., & Binkley, J. (1995). Assessing disability and change on individual  patients: a report of a patient specific measure. Physiotherapy [place], 47, [phone]. Reproduced with the permission of the authors  Score: Total = 9 Average = 3    SENSATION: WFL  EDEMA:  Mild edema Rt knee   PALPATION: TTP around Rt patella Unable to assess patellar mobility due to pain  LOWER EXTREMITY ROM:  Active ROM Right eval Left eval  Hip flexion    Hip extension    Hip abduction    Hip adduction    Hip internal rotation    Hip external rotation    Knee flexion 118 135  Knee extension 0 0  Ankle dorsiflexion    Ankle plantarflexion    Ankle inversion    Ankle eversion     (Blank rows = not tested)  LOWER EXTREMITY MMT:  MMT Right eval Left eval  Hip flexion 3 4  Hip extension    Hip abduction 3+ 4  Hip adduction    Hip internal rotation    Hip external rotation    Knee flexion 3   Knee extension 3+   Ankle dorsiflexion    Ankle plantarflexion    Ankle inversion    Ankle eversion     (Blank rows = not tested)    FUNCTIONAL TESTS:  2= 30 seconds chair stand test Timed up and go (TUG): 21.35 seconds   GAIT: Distance walked: 100' Assistive device utilized: Single point cane Level of assistance: Modified independence Comments: wide BOS, decreased Rt knee flexion, circumduction Rt hip  [facility] Adult PT Treatment:                                                DATE: 12/03/2023 Therapeutic Exercise: Ankle pumps & circles x10 each Seated knee flexion AROM with slider Long sitting HS stretch + towel behind knee Standing gastroc stretch  Neuromuscular re-ed: Quad set 10x3 (long sitting) LAQ 10x3 Seated hip add isometric + orange ball squeeze 10x3  Manual Therapy: IASTM distal lateral hamstring & ITB   [facility] Adult PT Treatment:                                                DATE: 11/30/23 Therapeutic Exercise: Ankle pumps x 10  Seated HS stretch x 30 sec   Seated heel slides on towel x 10  Seated hip adduction pillow squeeze x 10  Seated resisted march green band x 10  Neuromuscular re-ed: LAQ x 10  Quad set x 5  Seated hip abduction green band x  10  Self Care: Ice, elevation, compression, ankle pumps for pain/swelling Anatomy of the knee Recommended to f/u with referring provider regarding anti-inflammatory medication    [facility] Adult PT Treatment:                                                DATE: 11/28/2023 Therapeutic Exercise: Seated knee flexion AROM with slider  Supine quad set x4 Small range SLR x6 Therapeutic Activity: Applying ace bandage to knee --> instruction for patient on how to apply at home Gait: Gait training with [facility]                                                                                                                               TREATMENT DATE: 11/21/23 PT educated pt on PT POC and goals, HEP, rationale for treatment Gait with brace with attempting to adjust brace to prevent it sliding down. Brace continues to slide despite adjustments. PT advised pt to use ace wrap as pt states this works better for her at home    PATIENT EDUCATION:  Education details: see treatment; HEP update  Person educated: Patient Education method: Explanation, Demonstration, and Handouts Education comprehension: verbalized understanding and returned demonstration  HOME EXERCISE PROGRAM: Access Code: [email or web address] URL: [email or web address] Date: 11/30/2023 Prepared by: Lucie Meeter  Exercises - Side Stepping  - 1 x daily - 7 x weekly - 3 sets - 10 reps - Backward Walking  - 1 x daily - 7 x weekly - 3 sets - 10 reps - Seated Knee Flexion Extension AROM   - 3 x daily - 7 x weekly - 3 sets - 10 reps - Long Sitting Quad Set  - 1 x daily - 7 x weekly - 1 sets - 10 reps - Seated Long Arc Quad  - 1 x daily - 7 x weekly - 1 sets - 10 reps - Seated Ankle Pumps  - 1 x daily - 7 x weekly - 1 sets - 10 reps - Seated  Hip Abduction with Resistance  - 1 x daily - 7 x weekly - 1 sets - 10 reps - Seated March with Resistance  - 1 x daily - 7 x weekly - 1 sets - 10 reps - Seated Hip Adduction Isometrics with Ball  - 1 x daily - 7 x weekly - 1 sets - 10 reps  ASSESSMENT:  CLINICAL IMPRESSION: Patient able to tolerate more today as compared to previous sessions. Increased pain at lateral posterior knee in extension; noted tenderness and myofascial tightness palpated at distal hamstring and ITB. Patient demonstrated improved balance when ambulating with SPC as compared to previous sessions.   EVAL: Patient is a 68 y.o. female who was seen today for physical therapy evaluation and treatment for Rt knee pain. She presents with impaired gait and  balance, decreased strength, ROM and mobility. She will benefit from skilled PT to address deficits and improve functional mobility with decreased pain.   OBJECTIVE IMPAIRMENTS: Abnormal gait, decreased activity tolerance, decreased balance, difficulty walking, decreased ROM, decreased strength, and pain.   ACTIVITY LIMITATIONS: standing, squatting, stairs, bathing, and locomotion level  PARTICIPATION LIMITATIONS: meal prep, cleaning, driving, shopping, and community activity  PERSONAL FACTORS: Time since onset of injury/illness/exacerbation and 1-2 comorbidities: kidney transplant, history of ankle fracture are also affecting patient's functional outcome.   REHAB POTENTIAL: Good  CLINICAL DECISION MAKING: Evolving/moderate complexity  EVALUATION COMPLEXITY: Moderate   GOALS: Goals reviewed with patient? Yes  SHORT TERM GOALS: Target date: 12/19/2023  Pt will be independent in initial HEP Baseline: Goal status: INITIAL  2.  Pt will improve 30 second sit <> stand to >= 5 to demo improved strength Baseline:  Goal status: INITIAL   LONG TERM GOALS: Target date: 01/16/2024  Pt will be independent with advanced HEP Baseline:  Goal status: INITIAL  2.  Pt will  improve PSFS average score to >= 5 to demo improved functional mobility Baseline:  Goal status: INITIAL  3.  Pt will negotiate flight of stairs with reciprocal pattern with pain <= 3/10 Baseline:  Goal status: INITIAL  4.  Pt will improve TUG to <= 16 seconds to demo improved gait Baseline:  Goal status: INITIAL    PLAN:  PT FREQUENCY: 2x/week  PT DURATION: 8 weeks  PLANNED INTERVENTIONS: 97164- PT Re-evaluation, 97110-Therapeutic exercises, 97530- Therapeutic activity, 97112- Neuromuscular re-education, 97535- Self Care, [id number]- Manual therapy, [id number]- Aquatic Therapy, H9716- Electrical stimulation (unattended), 97016- Vasopneumatic device, 97033- Ionotophoresis 4mg /ml Dexamethasone, Patient/Family education, Balance training, Taping, Cryotherapy, and Moist heat  PLAN FOR NEXT SESSION: knee mobility and strength, continue gait training with SPC, stairs  Lamarr Price, PTA 12/03/23 11:49 AM

## 2023-12-07 ENCOUNTER — Ambulatory Visit

## 2023-12-07 DIAGNOSIS — M25561 Pain in right knee: Secondary | ICD-10-CM

## 2023-12-07 DIAGNOSIS — R262 Difficulty in walking, not elsewhere classified: Secondary | ICD-10-CM

## 2023-12-07 NOTE — Therapy (Signed)
 OUTPATIENT PHYSICAL THERAPY LOWER EXTREMITY TREATMENT   Patient Name: Sandy Moore MRN: [id number] DOB:[birth year], 68 y.o., female Today's Date: 12/07/2023  END OF SESSION:  PT End of Session - 12/07/23 1056     Visit Number 5    Number of Visits 16    Date for PT Re-Evaluation 01/16/24    Authorization Type Medicare    Progress Note Due on Visit 10    PT Start Time 1057    PT Stop Time 1142    PT Time Calculation (min) 45 min    Activity Tolerance Patient tolerated treatment well    Behavior During Therapy WFL for tasks assessed/performed         Past Medical History:  Diagnosis Date   Allergy    Anemia    Blood transfusion without reported diagnosis    End stage renal disease (HCC)    GERD (gastroesophageal reflux disease)    HTN (hypertension)    Hyperlipidemia    Hypothyroidism    Osteopenia    Prolapsed internal hemorrhoids, grade 2 12/08/2016   Status post dilation of esophageal narrowing    Past Surgical History:  Procedure Laterality Date   CESAREAN SECTION  1980, 1984   COLONOSCOPY     EXPLORATORY LAPAROTOMY  1983   HEMORRHOID BANDING     hysterectomy     KIDNEY TRANSPLANT  2013   Left ankle fracture repair     POLYPECTOMY     UPPER GASTROINTESTINAL ENDOSCOPY     Patient Active Problem List   Diagnosis Date Noted   Renal transplant recipient 10/16/2023   End stage renal disease (HCC)    IDA (iron  deficiency anemia) 09/11/2023   Prolapsed internal hemorrhoids, grade 2 12/08/2016   Anemia of renal disease 07/31/2011    PCP: Dorina Loving  REFERRING PROVIDER: Delane Lye  REFERRING DIAG: Rt knee pain  THERAPY DIAG:  Acute pain of right knee  Difficulty in walking, not elsewhere classified  Rationale for Evaluation and Treatment: Rehabilitation  ONSET DATE: 11/08/23  SUBJECTIVE:   SUBJECTIVE STATEMENT: Patient states she had pain and swelling behind the knee and up lateral thigh and was in pain for 2 days after last  visit. Patient states she made an appointment with ortho MD about pain in general. Patient states she is working on bending my knee (I.e. butt kickers) when standing/walking, states she has fallen into the wall a few times when doing chores around house, states she has not had any falls to the floor. Patient states her knee doesn't hurt unless I side step or I have been on my feet for too long. Patient states she has been icing her knee on the front and hamstring.  EVAL: Pt fell up the stairs 11/08/23 and had a Rt patellar subluxation. MD put patellar sublux back into place but she continues with Rt knee pain. She has been using [facility] for ambulation since she fell. She [facility] at [facility] but states that the brace slides down when she walks. She states pain increases the most with wt bearing, walking and standing. She has difficulty getting into the shower (tub shower) and with stair negotiation. Pain decreases with meds, ice.  PERTINENT HISTORY: Recently lost her husband, Kidney transplant, Afib PAIN:  Are you having pain? Yes: NPRS scale: 8 Pain location: Rt knee Pain description: sore, sharp Aggravating factors: standing, walking Relieving factors: ice, meds  PRECAUTIONS: None  RED FLAGS: None   WEIGHT BEARING RESTRICTIONS: No  FALLS:  Has patient fallen in last 6 months? Yes. 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   PATIENT SURVEYS:  Patient-specific activity scoring scheme (Point to one number):  0 represents "unable to perform." 10  represents "able to perform at prior level. 0 1 2 3 4 5 6 7 8 9  10 (Date and Score) Activity Initial  Activity Eval     stairs 3     Ambulate in house 4     In/out tub shower 2    Additional Additional Total score = sum of the activity scores/number of activities Minimum detectable change (90%CI) for average score = 2 points Minimum detectable change (90%CI) for single activity score = 3 points PSFS developed by: Rosalee MYRTIS Marvis KYM Charlet CHRISTELLA., & Binkley, J. (1995). Assessing disability and change on individual  patients: a report of a patient specific measure. Physiotherapy [place], 47, [phone]. Reproduced with the permission of the authors  Score: Total = 9 Average = 3    SENSATION: WFL  EDEMA:  Mild edema Rt knee   PALPATION: TTP around Rt patella Unable to assess patellar mobility due to pain  LOWER EXTREMITY ROM:  Active ROM Right eval Left eval  Hip flexion    Hip extension    Hip abduction    Hip adduction    Hip internal rotation    Hip external rotation    Knee flexion 118 135  Knee extension 0 0  Ankle dorsiflexion    Ankle plantarflexion    Ankle inversion    Ankle eversion     (Blank rows = not tested)  LOWER EXTREMITY MMT:  MMT Right eval Left eval  Hip flexion 3 4  Hip extension    Hip abduction 3+ 4  Hip adduction    Hip internal rotation    Hip external rotation    Knee flexion 3   Knee extension 3+   Ankle dorsiflexion    Ankle plantarflexion    Ankle inversion    Ankle eversion     (Blank rows = not tested)    FUNCTIONAL TESTS:  2= 30 seconds chair stand test Timed up and go (TUG): 21.35 seconds   GAIT: Distance walked: 100' Assistive device utilized: Single point cane Level of assistance: Modified independence Comments: wide BOS, decreased Rt knee flexion, circumduction Rt hip   [facility] Adult PT Treatment:                                                DATE: 12/07/2023 Therapeutic Exercise: Self-massage ITB  with 4 ball --> did not tolerate Self massage distal ITB with pool noodle roller --> barely tolerated Seated resisted marching + GTB to tolerance Neuromuscular re-ed: Quad set 10x3 (long sitting) LAQ 10x3 Seated hip add isometric + ball squeeze to tolerance Seated resisted hip abd + GTB to tolerance Gait: Gait training with SPC --> functional knee flexion & heel strike Side stepping at counter Self Care: Gentle massage along  ITB to increase tolerance to pressure and decrease myofascial tightness/tension Biofreeze samples for ITB/HS pain   [facility] Adult PT Treatment:                                                DATE: 12/03/2023 Therapeutic Exercise: Ankle pumps & circles x10 each Seated knee flexion AROM with slider Long sitting HS stretch + towel behind knee Standing gastroc stretch  Neuromuscular re-ed: Quad set 10x3 (long sitting) LAQ 10x3 Seated hip add isometric + orange ball squeeze 10x3  Manual Therapy: IASTM distal lateral hamstring & ITB   [facility] Adult PT Treatment:                                                DATE: 11/30/23 Therapeutic Exercise: Ankle pumps x 10  Seated HS stretch x 30 sec  Seated heel slides on towel x 10  Seated hip adduction pillow squeeze x 10  Seated resisted march green band x 10  Neuromuscular re-ed: LAQ x 10  Quad set x 5  Seated hip abduction green band x 10  Self Care: Ice, elevation, compression, ankle pumps for pain/swelling Anatomy of the knee Recommended to f/u with referring provider regarding anti-inflammatory medication                                                                                                                               PATIENT EDUCATION:  Education details: see treatment; HEP update  Person educated: Patient Education method: Explanation, Demonstration, and Handouts Education comprehension: verbalized understanding and returned demonstration  HOME EXERCISE PROGRAM: Access Code: [email or web address] URL:  [email or web address] Date: 11/30/2023 Prepared by: Lucie Meeter  Exercises - Side Stepping  - 1 x daily - 7 x weekly - 3 sets - 10 reps - Backward Walking  - 1 x daily - 7 x weekly - 3 sets - 10 reps - Seated Knee Flexion Extension AROM   - 3 x daily - 7 x weekly - 3 sets - 10 reps - Long Sitting Quad Set  - 1 x daily - 7 x weekly - 1 sets - 10 reps - Seated Long Arc Quad  - 1 x daily - 7 x weekly - 1 sets - 10 reps - Seated Ankle Pumps  - 1 x daily - 7 x weekly - 1 sets - 10 reps - Seated Hip Abduction with Resistance  - 1 x daily - 7 x weekly - 1 sets - 10 reps - Seated March with Resistance  - 1 x daily - 7 x weekly - 1 sets - 10 reps - Seated Hip Adduction Isometrics with Ball  - 1 x daily - 7 x  weekly - 1 sets - 10 reps  ASSESSMENT:  CLINICAL IMPRESSION: Hamstring and ITB stretching deferred today due to patient's request and not tolerating any level of pressure along ITB/lateral thigh. Attempted to discuss and instruct patient in gentle self-massage, however patient demonstrated very low tolerance and tenderness with touch (therapist did not perform manual, provided verbal instruction and visual demonstration instead). HEP reviewed and handouts printed for patient (states she did not have handouts). Seated hip strengthening exercises with resistance band trialed for tolerance; patient able to tolerate band placed around thighs and exercises completed to tolerance. Patient continues to demonstrate decreased knee flexion during gait; initial verbal cues needed, however over time functional knee flexion improved. Patient continues to require encouragement to complete exercises and progression is slow due to pain restriction.   EVAL: Patient is a 68 y.o. female who was seen today for physical therapy evaluation and treatment for Rt knee pain. She presents with impaired gait and balance, decreased strength, ROM and mobility. She will benefit from skilled PT to address deficits  and improve functional mobility with decreased pain.   OBJECTIVE IMPAIRMENTS: Abnormal gait, decreased activity tolerance, decreased balance, difficulty walking, decreased ROM, decreased strength, and pain.   ACTIVITY LIMITATIONS: standing, squatting, stairs, bathing, and locomotion level  PARTICIPATION LIMITATIONS: meal prep, cleaning, driving, shopping, and community activity  PERSONAL FACTORS: Time since onset of injury/illness/exacerbation and 1-2 comorbidities: kidney transplant, history of ankle fracture are also affecting patient's functional outcome.   REHAB POTENTIAL: Good  CLINICAL DECISION MAKING: Evolving/moderate complexity  EVALUATION COMPLEXITY: Moderate   GOALS: Goals reviewed with patient? Yes  SHORT TERM GOALS: Target date: 12/19/2023  Pt will be independent in initial HEP Baseline: Goal status: INITIAL  2.  Pt will improve 30 second sit <> stand to >= 5 to demo improved strength Baseline:  Goal status: INITIAL   LONG TERM GOALS: Target date: 01/16/2024  Pt will be independent with advanced HEP Baseline:  Goal status: INITIAL  2.  Pt will improve PSFS average score to >= 5 to demo improved functional mobility Baseline:  Goal status: INITIAL  3.  Pt will negotiate flight of stairs with reciprocal pattern with pain <= 3/10 Baseline:  Goal status: INITIAL  4.  Pt will improve TUG to <= 16 seconds to demo improved gait Baseline:  Goal status: INITIAL    PLAN:  PT FREQUENCY: 2x/week  PT DURATION: 8 weeks  PLANNED INTERVENTIONS: 97164- PT Re-evaluation, 97110-Therapeutic exercises, 97530- Therapeutic activity, [id number]- Neuromuscular re-education, 97535- Self Care, 02859- Manual therapy, [id number]- Aquatic Therapy, H9716- Electrical stimulation (unattended), 97016- Vasopneumatic device, [id number]- Ionotophoresis 4mg /ml Dexamethasone, Patient/Family education, Balance training, Taping, Cryotherapy, and Moist heat  PLAN FOR NEXT SESSION: ITB stretching of  tolerated. Progress knee mobility and strength as tolerated, continue gait training with SPC, stairs. Pain management. Aquatic therapy?  Lamarr Price, PTA 12/07/23 11:56 AM

## 2023-12-10 ENCOUNTER — Inpatient Hospital Stay (HOSPITAL_BASED_OUTPATIENT_CLINIC_OR_DEPARTMENT_OTHER): Admitting: Family

## 2023-12-10 ENCOUNTER — Encounter: Payer: Self-pay | Admitting: Family

## 2023-12-10 ENCOUNTER — Inpatient Hospital Stay: Attending: Hematology & Oncology

## 2023-12-10 VITALS — BP 135/59 | HR 60 | Temp 99.6°F | Resp 18 | Wt 231.1 lb

## 2023-12-10 DIAGNOSIS — D500 Iron deficiency anemia secondary to blood loss (chronic): Secondary | ICD-10-CM | POA: Insufficient documentation

## 2023-12-10 DIAGNOSIS — Z881 Allergy status to other antibiotic agents status: Secondary | ICD-10-CM | POA: Diagnosis not present

## 2023-12-10 DIAGNOSIS — K922 Gastrointestinal hemorrhage, unspecified: Secondary | ICD-10-CM | POA: Insufficient documentation

## 2023-12-10 DIAGNOSIS — Z91030 Bee allergy status: Secondary | ICD-10-CM | POA: Diagnosis not present

## 2023-12-10 DIAGNOSIS — Z79899 Other long term (current) drug therapy: Secondary | ICD-10-CM | POA: Insufficient documentation

## 2023-12-10 LAB — CBC WITH DIFFERENTIAL (CANCER CENTER ONLY)
Abs Immature Granulocytes: 0.05 10*3/uL (ref 0.00–0.07)
Basophils Absolute: 0.1 10*3/uL (ref 0.0–0.1)
Basophils Relative: 1 %
Eosinophils Absolute: 0.6 10*3/uL — ABNORMAL HIGH (ref 0.0–0.5)
Eosinophils Relative: 6 %
HCT: 35.5 % — ABNORMAL LOW (ref 36.0–46.0)
Hemoglobin: 11.4 g/dL — ABNORMAL LOW (ref 12.0–15.0)
Immature Granulocytes: 1 %
Lymphocytes Relative: 16 %
Lymphs Abs: 1.8 10*3/uL (ref 0.7–4.0)
MCH: 30.6 pg (ref 26.0–34.0)
MCHC: 32.1 g/dL (ref 30.0–36.0)
MCV: 95.4 fL (ref 80.0–100.0)
Monocytes Absolute: 1 10*3/uL (ref 0.1–1.0)
Monocytes Relative: 9 %
Neutro Abs: 7.4 10*3/uL (ref 1.7–7.7)
Neutrophils Relative %: 67 %
Platelet Count: 238 10*3/uL (ref 150–400)
RBC: 3.72 MIL/uL — ABNORMAL LOW (ref 3.87–5.11)
RDW: 16.3 % — ABNORMAL HIGH (ref 11.5–15.5)
WBC Count: 10.9 10*3/uL — ABNORMAL HIGH (ref 4.0–10.5)
nRBC: 0 % (ref 0.0–0.2)

## 2023-12-10 LAB — SAMPLE TO BLOOD BANK

## 2023-12-10 LAB — IRON AND IRON BINDING CAPACITY (CC-WL,HP ONLY)
Iron: 54 ug/dL (ref 28–170)
Saturation Ratios: 19 % (ref 10.4–31.8)
TIBC: 281 ug/dL (ref 250–450)
UIBC: 227 ug/dL (ref 148–442)

## 2023-12-10 LAB — RETICULOCYTES
Immature Retic Fract: 25.8 % — ABNORMAL HIGH (ref 2.3–15.9)
RBC.: 3.67 MIL/uL — ABNORMAL LOW (ref 3.87–5.11)
Retic Count, Absolute: 77.8 10*3/uL (ref 19.0–186.0)
Retic Ct Pct: 2.1 % (ref 0.4–3.1)

## 2023-12-10 LAB — FERRITIN: Ferritin: 230 ng/mL (ref 11–307)

## 2023-12-10 NOTE — Progress Notes (Unsigned)
 Hematology and Oncology Follow Up Visit  Sandy Moore [id number] November 17, 1955 68 y.o. 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 She is having a hard time with her right knee. She fell and thinks she may have torn her meniscus. She has been doing PT but the pain is too much. She has an appointment with ortho later today to discuss and plans to request an MRI.  She has had dark stools only after taking PO iron . No obvious blood loss.  Bruising on her arms with bumping into things. No petechiae noted.  No fever, chills, n/v, cough, rash, dizziness, SOB, chest pain, palpitations, abdominal pain or changes in bowel or bladder habits.  No syncope.  Puffiness in ankles is minimal. Pedal pulses are 1+.  Appetite and hydration are good. Weight is stable at 231 lbs.   ECOG Performance Status: 1 - Symptomatic but completely ambulatory  Medications:  Allergies as of 12/10/2023       Reactions   Bee Venom Anaphylaxis   Diovan [valsartan] Other (See Comments)   Pt said possible the reason she went into kidney failure.   Privigen [immune Globulin (human)] Other (See Comments)   Cause muscle contortions all over my body per Pt.   Erythromycin Rash        Medication List        Accurate as of December 10, 2023 10:54 AM. If you have any questions, ask your nurse or doctor.          acetaminophen  500 MG tablet Commonly known as: TYLENOL  Take by mouth.   atorvastatin 40 MG tablet Commonly known as: LIPITOR Take 40 mg by mouth daily.   cyanocobalamin  1000 MCG tablet Take 1,000 mcg by mouth every other day.   cycloSPORINE  modified 25 MG capsule Commonly known as: NEORAL  Take 150 mg by mouth 2 (two) times daily.   EPINEPHrine  0.3 mg/0.3 mL Soaj  injection Commonly known as: EPI-PEN Inject 0.3 mLs (0.3 mg total) into the muscle as needed for anaphylaxis.   estradiol 2 MG tablet Commonly known as: ESTRACE Take 1 tablet by mouth daily.   folic acid 1 MG tablet Commonly known as: FOLVITE Take 1 mg by mouth daily.   furosemide  40 MG tablet Commonly known as: LASIX  Take 40 mg by mouth as needed.   Iron  (Ferrous Sulfate ) 325 (65 Fe) MG Tabs Take 325 mg by mouth daily.   levothyroxine 112 MCG tablet Commonly known as: SYNTHROID Take 112 mcg by mouth daily before breakfast.   losartan 50 MG tablet Commonly known as: COZAAR Take 50 mg by mouth 2 (two) times daily.   MAG-OXIDE PO Take 266 mg by mouth 2 (two) times daily.   multivitamin-lutein Caps capsule Take 1 capsule by mouth daily.   mycophenolate 360 MG Tbec EC tablet Commonly known as: MYFORTIC mycophenolate sodium 360 mg tablet,delayed release   omeprazole 20 MG capsule Commonly known as: PRILOSEC Take 20 mg by mouth 2 (two) times daily before a meal.   ondansetron 4 MG tablet Commonly known as: ZOFRAN Take 4 mg by mouth every 8 (eight) hours as needed for nausea or vomiting.   OVER THE COUNTER MEDICATION CYA Target   predniSONE  5 MG tablet Commonly known as: DELTASONE  Take 5 mg by  mouth daily with breakfast.   Saxenda 18 MG/3ML Sopn Generic drug: Liraglutide -Weight Management daily.   sodium bicarbonate 650 MG tablet Take 1,300 mg by mouth 3 (three) times daily.   sulfamethoxazole-trimethoprim 400-80 MG tablet Commonly known as: BACTRIM Take 1 tablet by mouth 3 (three) times a week.   traMADol 50 MG tablet Commonly known as: ULTRAM Take 1 tablet by mouth every 4 (four) hours as needed.        Allergies:  Allergies  Allergen Reactions   Bee Venom Anaphylaxis   Diovan [Valsartan] Other (See Comments)    Pt said possible the reason she went into kidney failure.   Privigen [Immune Globulin (Human)] Other (See Comments)    Cause muscle  contortions all over my body per Pt.   Erythromycin Rash    Past Medical History, Surgical history, Social history, and Family History were reviewed and updated.  Review of Systems: All other 10 point review of systems is negative.   Physical Exam:  vitals were not taken for this visit.   Wt Readings from Last 3 Encounters:  10/29/23 233 lb (105.7 kg)  10/16/23 236 lb 3.2 oz (107.1 kg)  10/09/23 230 lb (104.3 kg)    Ocular: Sclerae unicteric, pupils equal, round and reactive to light Ear-nose-throat: Oropharynx clear, dentition fair Lymphatic: No cervical or supraclavicular adenopathy Lungs no rales or rhonchi, good excursion bilaterally Heart regular rate and rhythm, no murmur appreciated Abd soft, nontender, positive bowel sounds MSK no focal spinal tenderness, no joint edema Neuro: non-focal, well-oriented, appropriate affect Breasts: Deferred   Lab Results  Component Value Date   WBC 10.1 10/29/2023   HGB 11.7 (L) 10/29/2023   HCT 37.6 10/29/2023   MCV 94.7 10/29/2023   PLT 240 10/29/2023   Lab Results  Component Value Date   FERRITIN 326 (H) 10/29/2023   IRON  55 10/29/2023   TIBC 252 10/29/2023   UIBC 197 10/29/2023   IRONPCTSAT 22 10/29/2023   Lab Results  Component Value Date   RETICCTPCT 1.4 10/29/2023   RBC 3.90 10/29/2023   RETICCTABS 51.4 07/15/2009   No results found for: KPAFRELGTCHN, LAMBDASER, KAPLAMBRATIO No results found for: IGGSERUM, IGA, IGMSERUM No results found for: STEPHANY CARLOTA BENSON MARKEL EARLA JOANNIE DOC VICK, SPEI   Chemistry      Component Value Date/Time   NA 142 10/29/2023 1240   NA 142 01/12/2010 0815   K 4.4 10/29/2023 1240   K 4.7 01/12/2010 0815   CL 106 10/29/2023 1240   CL 104 01/12/2010 0815   CO2 25 10/29/2023 1240   CO2 26 01/12/2010 0815   BUN 30 (H) 10/29/2023 1240   BUN 29 (H) 01/12/2010 0815   CREATININE 1.35 (H) 10/29/2023 1240   CREATININE 3.0 (H) 01/12/2010  0815      Component Value Date/Time   CALCIUM 9.5 10/29/2023 1240   CALCIUM 10.2 01/12/2010 0815   ALKPHOS 92 10/29/2023 1240   ALKPHOS 109 (H) 01/12/2010 0815   AST 10 (L) 10/29/2023 1240   ALT 10 10/29/2023 1240   ALT 15 01/12/2010 0815   BILITOT 0.6 10/29/2023 1240       Impression and Plan: Sandy Moore is a very pleasant 68 yo caucasian female with anemia secondary to GI blood loss.  Iron  studies are pending. We will replace if needed.  Hgb is stable at 11.4 (prev 11.7)! No transfusion needed at this time.  Follow-up in 6 weeks.   Lauraine Pepper, NP 6/30/202510:54 AM

## 2023-12-11 ENCOUNTER — Encounter: Admitting: Physical Therapy

## 2023-12-11 ENCOUNTER — Encounter: Payer: Self-pay | Admitting: Family

## 2023-12-13 ENCOUNTER — Encounter

## 2023-12-18 ENCOUNTER — Ambulatory Visit

## 2023-12-21 ENCOUNTER — Encounter: Admitting: Physical Therapy

## 2023-12-25 ENCOUNTER — Encounter: Admitting: Physical Therapy

## 2023-12-27 ENCOUNTER — Ambulatory Visit: Attending: Family Medicine

## 2023-12-27 DIAGNOSIS — M25561 Pain in right knee: Secondary | ICD-10-CM | POA: Diagnosis present

## 2023-12-27 DIAGNOSIS — R262 Difficulty in walking, not elsewhere classified: Secondary | ICD-10-CM | POA: Diagnosis present

## 2023-12-27 NOTE — Therapy (Signed)
 OUTPATIENT PHYSICAL THERAPY LOWER EXTREMITY TREATMENT   Patient Name: Sandy Moore MRN: [id number] DOB:February 15, 1956, 68 y.o., female Today's Date: 12/27/2023  END OF SESSION:  PT End of Session - 12/27/23 1102     Visit Number 6    Number of Visits 16    Date for PT Re-Evaluation 01/16/24    Authorization Type Medicare    Progress Note Due on Visit 10    PT Start Time 1102    PT Stop Time 1142    PT Time Calculation (min) 40 min    Activity Tolerance Patient tolerated treatment well    Behavior During Therapy WFL for tasks assessed/performed          Past Medical History:  Diagnosis Date   Allergy    Anemia    Blood transfusion without reported diagnosis    End stage renal disease (HCC)    GERD (gastroesophageal reflux disease)    HTN (hypertension)    Hyperlipidemia    Hypothyroidism    Osteopenia    Prolapsed internal hemorrhoids, grade 2 12/08/2016   Status post dilation of esophageal narrowing    Past Surgical History:  Procedure Laterality Date   CESAREAN SECTION  1980, 1984   COLONOSCOPY     EXPLORATORY LAPAROTOMY  1983   HEMORRHOID BANDING     hysterectomy     KIDNEY TRANSPLANT  2013   Left ankle fracture repair     POLYPECTOMY     UPPER GASTROINTESTINAL ENDOSCOPY     Patient Active Problem List   Diagnosis Date Noted   Renal transplant recipient 10/16/2023   End stage renal disease (HCC)    IDA (iron  deficiency anemia) 09/11/2023   Prolapsed internal hemorrhoids, grade 2 12/08/2016   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 Was found to have meniscal tears and small  subchondral insufficiency fracture of medial tibial plateau. Was instructed to gently progress back into PT, but did not receive any specific restrictions from referring provider.   EVAL: Pt fell up the stairs 11/08/23 and had a Rt patellar subluxation. MD put patellar sublux back into place but she continues with Rt knee pain. She has been using [facility] for ambulation since she fell. She [facility] at [facility] but states that the brace slides down when she walks. She states pain increases the most with wt bearing, walking and standing. She has difficulty getting into the shower (tub shower) and with stair negotiation. Pain decreases with meds, ice.  PERTINENT HISTORY: Recently lost her husband, Kidney transplant, Afib PAIN:  Are you having pain? Yes: NPRS scale: 3 Pain location: Rt medial knee Pain description: sore, sting Aggravating factors: standing, walking Relieving factors: ice, meds  PRECAUTIONS: None  RED FLAGS: None   WEIGHT BEARING RESTRICTIONS: No  FALLS:  Has patient fallen in last 6 months? Yes. 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   PATIENT SURVEYS:  Patient-specific activity scoring scheme (Point to one number):  0 represents "unable to perform." 10 represents "able to perform at prior level. 0 1 2 3 4 5 6 7 8 9  10 (Date and Score) Activity Initial  Activity Eval     stairs 3     Ambulate in house 4     In/out tub shower 2    Additional Additional Total score = sum of the activity scores/number of activities Minimum  detectable change (90%CI) for average score = 2 points Minimum detectable change (90%CI) for single activity score = 3 points PSFS developed by: Rosalee MYRTIS Marvis KYM Charlet CHRISTELLA., & Binkley, J. (1995). Assessing disability and change on individual  patients: a report of a patient specific measure. Physiotherapy [place], 47, [phone]. Reproduced with the permission of the authors  Score: Total = 9 Average = 3    SENSATION: WFL  EDEMA:  Mild edema Rt knee   PALPATION: TTP around Rt patella Unable to assess patellar mobility due to pain  LOWER EXTREMITY ROM:  Active ROM Right eval Left eval  Hip flexion    Hip extension    Hip abduction    Hip adduction    Hip internal rotation    Hip external rotation    Knee flexion 118 135  Knee extension 0 0  Ankle dorsiflexion    Ankle plantarflexion    Ankle inversion    Ankle eversion     (Blank rows = not tested)  LOWER EXTREMITY MMT:  MMT Right eval Left eval  Hip flexion 3 4  Hip extension    Hip abduction 3+ 4  Hip adduction    Hip internal rotation    Hip external rotation    Knee flexion 3   Knee extension 3+   Ankle dorsiflexion    Ankle plantarflexion    Ankle inversion    Ankle eversion     (Blank rows = not tested)    FUNCTIONAL TESTS:  2= 30 seconds chair stand test Timed up and go (TUG): 21.35 seconds   12/27/23: 30 second chair stand test: 7 reps  GAIT: Distance walked: 100' Assistive device utilized: Single point cane Level of assistance: Modified independence Comments: wide BOS, decreased Rt knee flexion, circumduction Rt hip  [facility] Adult PT Treatment:                                                DATE: 12/27/23 Therapeutic Exercise: Heel slides x 10  Hooklying resisted hip flexion black band 2 x 10  HEP update   Neuromuscular re-ed: Hip bridge 2 x 10  Hooklying hip abduction black band 2 x 10  LAQ 2 x 10; 3 sec hold    [facility] Adult PT Treatment:                                                 DATE: 12/07/2023 Therapeutic Exercise: Self-massage ITB with 4 ball --> did not tolerate Self massage distal ITB with pool noodle roller --> barely tolerated Seated resisted marching + GTB  to tolerance Neuromuscular re-ed: Quad set 10x3 (long sitting) LAQ 10x3 Seated hip add isometric + ball squeeze to tolerance Seated resisted hip abd + GTB to tolerance Gait: Gait training with SPC --> functional knee flexion & heel strike Side stepping at counter Self Care: Gentle massage along ITB to increase tolerance to pressure and decrease myofascial tightness/tension Biofreeze samples for ITB/HS pain   [facility] Adult PT Treatment:                                                DATE: 12/03/2023 Therapeutic Exercise: Ankle pumps & circles x10 each Seated knee flexion AROM with slider Long sitting HS stretch + towel behind knee Standing gastroc stretch  Neuromuscular re-ed: Quad set 10x3 (long sitting) LAQ 10x3 Seated hip add isometric + orange ball squeeze 10x3  Manual Therapy: IASTM distal lateral hamstring & ITB                                                                                                                               PATIENT EDUCATION:  Education details: HEP update  Person educated: Patient Education method: Explanation, Demonstration, Tactile cues, Verbal cues, and Handouts Education comprehension: verbalized understanding and returned demonstration  HOME EXERCISE PROGRAM: Access Code: [email or web address] URL: [email or web address] Date: 12/27/2023 Prepared by: Lucie Meeter  Exercises - Seated Knee Flexion Extension AROM   - 3 x daily - 7 x weekly - 3 sets - 10 reps - Long Sitting Quad Set  - 1 x daily - 7 x weekly - 1 sets - 10 reps - Seated Long Arc Quad  - 1 x daily - 7 x weekly - 1 sets - 10 reps - Seated Ankle Pumps  - 1 x daily - 7 x weekly - 1 sets - 10 reps - Seated Hip Abduction with Resistance  - 1 x daily - 7 x weekly - 1 sets - 10  reps - Seated March with Resistance  - 1 x daily - 7 x weekly - 1 sets - 10 reps - Seated Hip Adduction Isometrics with Ball  - 1 x daily - 7 x weekly - 1 sets - 10 reps  ASSESSMENT:  CLINICAL IMPRESSION: Patient underwent knee injection on Tuesday reporting overall improvement in pain since this intervention. MRI revealed meniscal tearing and insufficiency fracture of the tibial plateau. Was instructed by referring provider to slowly progress back into PT. We completed gentle knee ROM and knee/hip strengthening with good tolerance. She fatigues quickly with hip strengthening, but this does not exacerbate knee pain. 30 second sit to stand has significantly improved, having met this STG.   EVAL: Patient is a 68 y.o. female who was seen today for physical therapy evaluation and treatment for Rt knee pain. She presents with impaired gait and balance, decreased strength, ROM and mobility.  She will benefit from skilled PT to address deficits and improve functional mobility with decreased pain.   OBJECTIVE IMPAIRMENTS: Abnormal gait, decreased activity tolerance, decreased balance, difficulty walking, decreased ROM, decreased strength, and pain.   ACTIVITY LIMITATIONS: standing, squatting, stairs, bathing, and locomotion level  PARTICIPATION LIMITATIONS: meal prep, cleaning, driving, shopping, and community activity  PERSONAL FACTORS: Time since onset of injury/illness/exacerbation and 1-2 comorbidities: kidney transplant, history of ankle fracture are also affecting patient's functional outcome.   REHAB POTENTIAL: Good  CLINICAL DECISION MAKING: Evolving/moderate complexity  EVALUATION COMPLEXITY: Moderate   GOALS: Goals reviewed with patient? Yes  SHORT TERM GOALS: Target date: 12/19/2023  Pt will be independent in initial HEP Baseline: Goal status: MET  2.  Pt will improve 30 second sit <> stand to >= 5 to demo improved strength Baseline:  Goal status: MET   LONG TERM GOALS: Target  date: 01/16/2024  Pt will be independent with advanced HEP Baseline:  Goal status: INITIAL  2.  Pt will improve PSFS average score to >= 5 to demo improved functional mobility Baseline:  Goal status: INITIAL  3.  Pt will negotiate flight of stairs with reciprocal pattern with pain <= 3/10 Baseline:  Goal status: INITIAL  4.  Pt will improve TUG to <= 16 seconds to demo improved gait Baseline:  Goal status: INITIAL    PLAN:  PT FREQUENCY: 2x/week  PT DURATION: 8 weeks  PLANNED INTERVENTIONS: 97164- PT Re-evaluation, 97110-Therapeutic exercises, 97530- Therapeutic activity, [id number]- Neuromuscular re-education, 97535- Self Care, [id number]- Manual therapy, [id number]- Aquatic Therapy, [id number]- Electrical stimulation (unattended), 97016- Vasopneumatic device, [id number]- Ionotophoresis 4mg /ml Dexamethasone, Patient/Family education, Balance training, Taping, Cryotherapy, and Moist heat  PLAN FOR NEXT SESSION: Continue with gentle knee/hip strengthening and ROM to tolerance. Progressive weight-bearing to tolerance being mindful of known insuffiencey fracture.  Lu Paradise, PT, DPT, ATC 12/27/23 11:44 AM

## 2024-01-08 ENCOUNTER — Ambulatory Visit: Admitting: Physical Therapy

## 2024-01-08 ENCOUNTER — Encounter: Payer: Self-pay | Admitting: Physical Therapy

## 2024-01-08 DIAGNOSIS — R262 Difficulty in walking, not elsewhere classified: Secondary | ICD-10-CM

## 2024-01-08 DIAGNOSIS — M25561 Pain in right knee: Secondary | ICD-10-CM | POA: Diagnosis not present

## 2024-01-08 NOTE — Therapy (Signed)
 OUTPATIENT PHYSICAL THERAPY LOWER EXTREMITY TREATMENT   Patient Name: Sandy Moore MRN: [id number] DOB:[birth year], 68 y.o., female Today's Date: 01/08/2024  END OF SESSION:  PT End of Session - 01/08/24 1014     Visit Number 7    Number of Visits 16    Date for PT Re-Evaluation 01/16/24    Authorization - Visit Number 7    Progress Note Due on Visit 10    PT Start Time 0930    PT Stop Time 1014    PT Time Calculation (min) 44 min    Activity Tolerance Patient tolerated treatment well    Behavior During Therapy WFL for tasks assessed/performed           Past Medical History:  Diagnosis Date   Allergy    Anemia    Blood transfusion without reported diagnosis    End stage renal disease (HCC)    GERD (gastroesophageal reflux disease)    HTN (hypertension)    Hyperlipidemia    Hypothyroidism    Osteopenia    Prolapsed internal hemorrhoids, grade 2 12/08/2016   Status post dilation of esophageal narrowing    Past Surgical History:  Procedure Laterality Date   CESAREAN SECTION  1980, 1984   COLONOSCOPY     EXPLORATORY LAPAROTOMY  1983   HEMORRHOID BANDING     hysterectomy     KIDNEY TRANSPLANT  2013   Left ankle fracture repair     POLYPECTOMY     UPPER GASTROINTESTINAL ENDOSCOPY     Patient Active Problem List   Diagnosis Date Noted   Renal transplant recipient 10/16/2023   End stage renal disease (HCC)    IDA (iron  deficiency anemia) 09/11/2023   Prolapsed internal hemorrhoids, grade 2 12/08/2016   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 She states the injection helped a lot. She did a lot of  walking yesterday and is a little sore  EVAL: Pt fell up the stairs 11/08/23 and had a Rt patellar subluxation. MD put patellar sublux back into place but she continues with Rt knee pain. She has been using [facility] for ambulation since she fell. [facility] but states that the brace slides down when she walks. She states pain increases the most with wt bearing, walking and standing. She has difficulty getting into the shower (tub shower) and with stair negotiation. Pain decreases with meds, ice.  PERTINENT HISTORY: Recently lost her husband, Kidney transplant, Afib PAIN:  Are you having pain? Yes: NPRS scale: 3 Pain location: Rt medial knee Pain description: sore, sting Aggravating factors: standing, walking Relieving factors: ice, meds  PRECAUTIONS: None  RED FLAGS: None   WEIGHT BEARING RESTRICTIONS: No  FALLS:  Has patient fallen in last 6 months? Yes. 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   PATIENT SURVEYS:  Patient-specific activity scoring scheme (Point to one number):  0 represents "unable to perform." 10 represents "able to perform at prior level. 0 1 2 3 4 5 6 7 8 9  10 (Date and Score) Activity Initial  Activity Eval     stairs 3     Ambulate in house 4     In/out tub shower 2    Additional Additional Total score = sum of the activity scores/number of activities Minimum detectable change (90%CI) for average score = 2 points Minimum detectable change (90%CI) for single activity score = 3 points PSFS developed by:  Rosalee MYRTIS Marvis KYM Charlet CHRISTELLA., & Binkley, J. (1995). Assessing disability and change on individual  patients: a report of a patient specific measure. Physiotherapy [place], 47, [phone]. Reproduced with the permission of the authors  Score: Total = 9 Average = 3    SENSATION: WFL  EDEMA:  Mild edema Rt knee   PALPATION: TTP around Rt patella Unable to assess patellar mobility due to pain  LOWER EXTREMITY ROM:  Active ROM Right eval Left eval  Hip flexion    Hip extension    Hip abduction    Hip adduction    Hip internal rotation    Hip external rotation    Knee flexion 118 135  Knee extension 0 0  Ankle dorsiflexion    Ankle plantarflexion    Ankle inversion    Ankle eversion     (Blank rows = not tested)  LOWER EXTREMITY MMT:  MMT Right eval Left eval  Hip flexion 3 4  Hip extension    Hip abduction 3+ 4  Hip adduction    Hip internal rotation    Hip external rotation    Knee flexion 3   Knee extension 3+   Ankle dorsiflexion    Ankle plantarflexion    Ankle inversion    Ankle eversion     (Blank rows = not tested)    FUNCTIONAL TESTS:  2= 30 seconds chair stand test Timed up and go (TUG): 21.35 seconds   12/27/23: 30 second chair stand test: 7 reps  GAIT: Distance walked: 100' Assistive device utilized: Single point cane Level of assistance: Modified independence Comments: wide BOS, decreased Rt knee flexion, circumduction Rt hip  [facility] Adult PT Treatment:                                                DATE: 01/08/24 Therapeutic Exercise/Activity: Step ups 4'' step 3 x 10 Nustep L6 x 5 min for endurance and strength Hooklying resisted hip flexion black TB 2 x 10 Hooklying hip abd black TB 2 x 10 Bridge 2 x 10 LAQ with adductor squeeze 2 x 10 Tandem stance on airex 2 x 30 sec Narrow BOS on airex with head turns, nods   [facility] Adult PT Treatment:                                                DATE: 12/27/23 Therapeutic Exercise: Heel  slides x 10  Hooklying resisted hip flexion black band 2 x 10  HEP update   Neuromuscular re-ed: Hip bridge  2 x 10  Hooklying hip abduction black band 2 x 10  LAQ 2 x 10; 3 sec hold    [facility] Adult PT Treatment:                                                DATE: 12/07/2023 Therapeutic Exercise: Self-massage ITB with 4 ball --> did not tolerate Self massage distal ITB with pool noodle roller --> barely tolerated Seated resisted marching + GTB to tolerance Neuromuscular re-ed: Quad set 10x3 (long sitting) LAQ 10x3 Seated hip add isometric + ball squeeze to tolerance Seated resisted hip abd + GTB to tolerance Gait: Gait training with SPC --> functional knee flexion & heel strike Side stepping at counter Self Care: Gentle massage along ITB to increase tolerance to pressure and decrease myofascial tightness/tension Biofreeze samples for ITB/HS pain   [facility] Adult PT Treatment:                                                DATE: 12/03/2023 Therapeutic Exercise: Ankle pumps & circles x10 each Seated knee flexion AROM with slider Long sitting HS stretch + towel behind knee Standing gastroc stretch  Neuromuscular re-ed: Quad set 10x3 (long sitting) LAQ 10x3 Seated hip add isometric + orange ball squeeze 10x3  Manual Therapy: IASTM distal lateral hamstring & ITB                                                                                                                               PATIENT EDUCATION:  Education details: HEP update  Person educated: Patient Education method: Explanation, Demonstration, Tactile cues, Verbal cues, and Handouts Education comprehension: verbalized understanding and returned demonstration  HOME EXERCISE PROGRAM: Access Code: [email or web address] URL: [email or web address] Date: 12/27/2023 Prepared by: Lucie Meeter  Exercises - Seated Knee Flexion Extension AROM   - 3 x daily - 7 x weekly - 3 sets - 10 reps - Long Sitting Quad Set  -  1 x daily - 7 x weekly - 1 sets - 10 reps - Seated Long Arc Quad  - 1 x daily - 7 x weekly - 1 sets - 10 reps - Seated Ankle Pumps  - 1 x daily - 7 x weekly - 1 sets - 10 reps - Seated Hip Abduction with Resistance  - 1 x daily - 7 x weekly - 1 sets - 10 reps - Seated March with Resistance  - 1 x daily - 7 x weekly - 1 sets - 10 reps - Seated Hip Adduction Isometrics with Ball  - 1 x daily - 7 x weekly - 1 sets - 10 reps  ASSESSMENT:  CLINICAL IMPRESSION: Pt with much improved  activity tolerance and mobility. She was able to tolerate progression of exercises without increase in pain. She continues with deficits in muscular endurance and understands recommendations to increase daily standing and walking activities slowly.  EVAL: Patient is a 68 y.o. female who was seen today for physical therapy evaluation and treatment for Rt knee pain. She presents with impaired gait and balance, decreased strength, ROM and mobility. She will benefit from skilled PT to address deficits and improve functional mobility with decreased pain.   OBJECTIVE IMPAIRMENTS: Abnormal gait, decreased activity tolerance, decreased balance, difficulty walking, decreased ROM, decreased strength, and pain.     GOALS: Goals reviewed with patient? Yes  SHORT TERM GOALS: Target date: 12/19/2023  Pt will be independent in initial HEP Baseline: Goal status: MET  2.  Pt will improve 30 second sit <> stand to >= 5 to demo improved strength Baseline:  Goal status: MET   LONG TERM GOALS: Target date: 01/16/2024  Pt will be independent with advanced HEP Baseline:  Goal status: INITIAL  2.  Pt will improve PSFS average score to >= 5 to demo improved functional mobility Baseline:  Goal status: INITIAL  3.  Pt will negotiate flight of stairs with reciprocal pattern with pain <= 3/10 Baseline:  Goal status: INITIAL  4.  Pt will improve TUG to <= 16 seconds to demo improved gait Baseline:  Goal status:  INITIAL    PLAN:  PT FREQUENCY: 2x/week  PT DURATION: 8 weeks  PLANNED INTERVENTIONS: 97164- PT Re-evaluation, 97110-Therapeutic exercises, 97530- Therapeutic activity, [id number]- Neuromuscular re-education, 97535- Self Care, [id number]- Manual therapy, [id number]- Aquatic Therapy, H9716- Electrical stimulation (unattended), 97016- Vasopneumatic device, [id number]- Ionotophoresis 4mg /ml Dexamethasone, Patient/Family education, Balance training, Taping, Cryotherapy, and Moist heat  PLAN FOR NEXT SESSION: Continue with gentle knee/hip strengthening and ROM to tolerance. Progressive weight-bearing to tolerance being mindful of known insuffiencey fracture.   Darice Conine, PT,DPT07/29/2510:15 AM

## 2024-01-11 ENCOUNTER — Ambulatory Visit: Attending: Family Medicine

## 2024-01-11 DIAGNOSIS — R262 Difficulty in walking, not elsewhere classified: Secondary | ICD-10-CM | POA: Diagnosis present

## 2024-01-11 DIAGNOSIS — M25561 Pain in right knee: Secondary | ICD-10-CM | POA: Diagnosis present

## 2024-01-11 NOTE — Therapy (Signed)
 OUTPATIENT PHYSICAL THERAPY LOWER EXTREMITY TREATMENT  PHYSICAL THERAPY DISCHARGE SUMMARY  Visits from Start of Care: 8  Current functional level related to goals / functional outcomes: See goals below   Remaining deficits: See impression   Education / Equipment: See education    Patient agrees to discharge. Patient goals were mostly met. Patient is being discharged due to being pleased with the current functional level.  Patient Name: Sandy Moore MRN: [id number] DOB:12/23/1955, 68 y.o., female Today's Date: 01/11/2024  END OF SESSION:  PT End of Session - 01/11/24 1020     Visit Number 8    Number of Visits 16    Date for PT Re-Evaluation 01/16/24    Progress Note Due on Visit 10    PT Start Time 1016    PT Stop Time 1054    PT Time Calculation (min) 38 min    Activity Tolerance Patient tolerated treatment well    Behavior During Therapy WFL for tasks assessed/performed            Past Medical History:  Diagnosis Date   Allergy    Anemia    Blood transfusion without reported diagnosis    End stage renal disease (HCC)    GERD (gastroesophageal reflux disease)    HTN (hypertension)    Hyperlipidemia    Hypothyroidism    Osteopenia    Prolapsed internal hemorrhoids, grade 2 12/08/2016   Status post dilation of esophageal narrowing    Past Surgical History:  Procedure Laterality Date   CESAREAN SECTION  1980, 1984   COLONOSCOPY     EXPLORATORY LAPAROTOMY  1983   HEMORRHOID BANDING     hysterectomy     KIDNEY TRANSPLANT  2013   Left ankle fracture repair     POLYPECTOMY     UPPER GASTROINTESTINAL ENDOSCOPY     Patient Active Problem List   Diagnosis Date Noted   Renal transplant recipient 10/16/2023   End stage renal disease (HCC)    IDA (iron  deficiency anemia) 09/11/2023   Prolapsed internal hemorrhoids, grade 2 12/08/2016   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 Will still get sore if she overdoes it. Feels like today can be her last day.   EVAL: Pt fell up the stairs 11/08/23 and had a Rt patellar subluxation. MD put patellar sublux back into place but she continues with Rt knee pain. She has been using [facility] for ambulation since she fell. [facility] but states that the brace slides down when she walks. She states pain increases the most with wt bearing, walking and standing. She has difficulty getting into the shower (tub shower) and with stair negotiation. Pain decreases with meds, ice.  PERTINENT HISTORY: Recently lost her husband, Kidney transplant, Afib PAIN:  Are you having pain? Yes: NPRS scale: none currently; at worst 3-4 Pain location: Rt medial knee Pain description: ache Aggravating factors: standing, walking Relieving factors: ice, meds  PRECAUTIONS: None  RED FLAGS: None   WEIGHT BEARING RESTRICTIONS: No  FALLS:  Has patient fallen in last 6 months? Yes. 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   PATIENT SURVEYS:  Patient-specific activity scoring scheme (Point to one number):  0 represents "unable to perform." 10 represents "able to perform at prior level. 0 1 2 3 4 5 6 7 8 9  10 (Date and  Score) Activity Initial  Activity Eval   01/11/24  stairs 3   8  Ambulate in house 4   10  In/out tub shower 2 8   Additional Additional Total score = sum of the activity scores/number of activities Minimum detectable change (90%CI) for average score = 2 points Minimum detectable change (90%CI) for single activity score = 3 points PSFS developed by: Rosalee MYRTIS Marvis KYM Charlet CHRISTELLA., & Binkley, J. (1995). Assessing disability and change on individual  patients: a report of a patient specific measure. Physiotherapy [place], 47, [phone]. Reproduced with the permission of the authors  Score: Total = 9 Average = 3  01/11/24: average: 9     SENSATION: WFL  EDEMA:  Mild edema Rt knee   PALPATION: TTP around Rt patella Unable to assess patellar mobility due to pain  LOWER EXTREMITY ROM:  Active ROM Right eval Left eval  Hip flexion    Hip extension    Hip abduction    Hip adduction    Hip internal rotation    Hip external rotation    Knee flexion 118 135  Knee extension 0 0  Ankle dorsiflexion    Ankle plantarflexion    Ankle inversion    Ankle eversion     (Blank rows = not tested)  LOWER EXTREMITY MMT:  MMT Right eval Left eval  Hip flexion 3 4  Hip extension    Hip abduction 3+ 4  Hip adduction    Hip internal rotation    Hip external rotation    Knee flexion 3   Knee extension 3+   Ankle dorsiflexion    Ankle plantarflexion    Ankle inversion    Ankle eversion     (Blank rows = not tested)    FUNCTIONAL TESTS:  2= 30 seconds chair stand test Timed up and go (TUG): 21.35 seconds   12/27/23: 30 second chair stand test: 7 reps  GAIT: Distance walked: 100' Assistive device utilized: Single point cane Level of assistance: Modified independence Comments: wide BOS, decreased Rt knee flexion, circumduction Rt hip [facility] Adult PT Treatment:                                                DATE: 01/11/24 Therapeutic Exercise: Reviewed and updated HEP  discussing frequency, sets, reps, and ways to progress independently.  Discussed aquatic exercises with handout provided  Therapeutic Activity: Re-assessment to determine overall progress, educating patient on overall progress towards goals  Self Care: Fall prevention at home checklist Activities to avoid including deep squatting, pivoting activity.  Ice for pain   [facility] Adult PT Treatment:  DATE: 01/08/24 Therapeutic Exercise/Activity: Step ups 4'' step 3 x 10 Nustep L6 x 5 min for endurance and strength Hooklying resisted hip flexion black TB 2 x 10 Hooklying hip abd black TB 2 x 10 Bridge 2 x 10 LAQ with adductor squeeze 2 x 10 Tandem stance on airex 2 x 30 sec Narrow BOS on airex with head turns, nods   [facility] Adult PT Treatment:                                                DATE: 12/27/23 Therapeutic Exercise: Heel slides x 10  Hooklying resisted hip flexion black band 2 x 10  HEP update   Neuromuscular re-ed: Hip bridge 2 x 10  Hooklying hip abduction black band 2 x 10  LAQ 2 x 10; 3 sec hold                                                                                                                                 PATIENT EDUCATION:  Education details: see treatment; d/c education  Person educated: Patient Education method: Explanation, Demonstration, and Handouts Education comprehension: verbalized understanding and returned demonstration  HOME EXERCISE PROGRAM: Access Code: [email or web address] URL: [email or web address] Date: 01/11/2024 Prepared by: Lucie Meeter  Exercises - Seated Long Arc Quad  - 1 x daily - 7 x weekly - 1 sets - 10 reps - Seated Hip Abduction with Resistance  - 1 x daily - 7 x weekly - 1 sets - 10 reps - Seated March with Resistance  - 1 x daily - 7 x weekly - 1 sets - 10 reps - Sit to Stand  - 1 x daily - 7 x weekly - 2 sets - 10 reps - Seated Hip Adduction Isometrics with Ball  - 1 x  daily - 7 x weekly - 1 sets - 10 reps - Step Taps on High Step  - 1 x daily - 7 x weekly - 1 sets - 10 reps - Standing Hip Abduction Adduction at [place]  - 1 x daily - 7 x weekly - 3 sets - 10 reps - Heel Toe Raises at [place]  - 1 x daily - 7 x weekly - 3 sets - 10 reps - Lateral Stepping at [place]  - 1 x daily - 7 x weekly - 3 sets - 10 reps - Standing March at [facility]  - 1 x daily - 7 x weekly - 3 sets - 10 reps - Standing Hip Flexion Extension at [employer]  - 1 x daily - 7 x weekly - 3 sets - 10 reps  Patient Education - Falls at Home Checklist  ASSESSMENT:  CLINICAL IMPRESSION: Benita has progressed well in PT reporting significant improvement in knee pain. She demonstrates improvements in functional strength  and mobility per the TUG and 30 second sit to stand. She has met all established functional goals with exception of stair negotiation as she is only occasionally completing with reciprocal pattern, but states this is due to lack of confidence and not due to knee pain. She feels that she is ready for discharge and is independent with HEP that was issued today.   EVAL: Patient is a 68 y.o. female who was seen today for physical therapy evaluation and treatment for Rt knee pain. She presents with impaired gait and balance, decreased strength, ROM and mobility. She will benefit from skilled PT to address deficits and improve functional mobility with decreased pain.   OBJECTIVE IMPAIRMENTS: Abnormal gait, decreased activity tolerance, decreased balance, difficulty walking, decreased ROM, decreased strength, and pain.     GOALS: Goals reviewed with patient? Yes  SHORT TERM GOALS: Target date: 12/19/2023  Pt will be independent in initial HEP Baseline: Goal status: MET  2.  Pt will improve 30 second sit <> stand to >= 5 to demo improved strength Baseline:  Goal status: MET   LONG TERM GOALS: Target date: 01/16/2024  Pt will be independent with advanced HEP Baseline:   Goal status: MET  2.  Pt will improve PSFS average score to >= 5 to demo improved functional mobility Baseline:  Goal status: MET  3.  Pt will negotiate flight of stairs with reciprocal pattern with pain <= 3/10 Baseline:  01/11/24: is alternating about 1/2 way on the stairs, no pain  Goal status: partially met   4.  Pt will improve TUG to <= 16 seconds to demo improved gait Baseline:  01/11/24: 9 seconds  Goal status: MET    PLAN:  PT FREQUENCY: 2x/week  PT DURATION: 8 weeks  PLANNED INTERVENTIONS: 97164- PT Re-evaluation, 97110-Therapeutic exercises, 97530- Therapeutic activity, 97112- Neuromuscular re-education, 97535- Self Care, 02859- Manual therapy, [id number]- Aquatic Therapy, [id number]- Electrical stimulation (unattended), 97016- Vasopneumatic device, [id number]- Ionotophoresis 4mg /ml Dexamethasone, Patient/Family education, Balance training, Taping, Cryotherapy, and Moist heat    Lucie Meeter, PT, DPT, ATC 01/11/24 10:54 AM

## 2024-01-15 ENCOUNTER — Ambulatory Visit: Admitting: Physical Therapy

## 2024-01-18 ENCOUNTER — Encounter: Admitting: Physical Therapy

## 2024-01-21 ENCOUNTER — Inpatient Hospital Stay (HOSPITAL_BASED_OUTPATIENT_CLINIC_OR_DEPARTMENT_OTHER): Admitting: Family

## 2024-01-21 ENCOUNTER — Inpatient Hospital Stay: Attending: Hematology & Oncology

## 2024-01-21 VITALS — BP 149/64 | HR 63 | Temp 98.3°F | Resp 18 | Ht 64.0 in | Wt 234.1 lb

## 2024-01-21 DIAGNOSIS — K922 Gastrointestinal hemorrhage, unspecified: Secondary | ICD-10-CM | POA: Insufficient documentation

## 2024-01-21 DIAGNOSIS — Z881 Allergy status to other antibiotic agents status: Secondary | ICD-10-CM | POA: Insufficient documentation

## 2024-01-21 DIAGNOSIS — Z79899 Other long term (current) drug therapy: Secondary | ICD-10-CM | POA: Diagnosis not present

## 2024-01-21 DIAGNOSIS — Z91030 Bee allergy status: Secondary | ICD-10-CM | POA: Insufficient documentation

## 2024-01-21 DIAGNOSIS — D500 Iron deficiency anemia secondary to blood loss (chronic): Secondary | ICD-10-CM | POA: Insufficient documentation

## 2024-01-21 DIAGNOSIS — R5383 Other fatigue: Secondary | ICD-10-CM | POA: Diagnosis not present

## 2024-01-21 LAB — CBC WITH DIFFERENTIAL (CANCER CENTER ONLY)
Abs Immature Granulocytes: 0.11 K/uL — ABNORMAL HIGH (ref 0.00–0.07)
Basophils Absolute: 0.1 K/uL (ref 0.0–0.1)
Basophils Relative: 1 %
Eosinophils Absolute: 0.3 K/uL (ref 0.0–0.5)
Eosinophils Relative: 3 %
HCT: 38.6 % (ref 36.0–46.0)
Hemoglobin: 12.1 g/dL (ref 12.0–15.0)
Immature Granulocytes: 1 %
Lymphocytes Relative: 18 %
Lymphs Abs: 1.9 K/uL (ref 0.7–4.0)
MCH: 31.1 pg (ref 26.0–34.0)
MCHC: 31.3 g/dL (ref 30.0–36.0)
MCV: 99.2 fL (ref 80.0–100.0)
Monocytes Absolute: 0.9 K/uL (ref 0.1–1.0)
Monocytes Relative: 8 %
Neutro Abs: 7.6 K/uL (ref 1.7–7.7)
Neutrophils Relative %: 69 %
Platelet Count: 184 K/uL (ref 150–400)
RBC: 3.89 MIL/uL (ref 3.87–5.11)
RDW: 15.7 % — ABNORMAL HIGH (ref 11.5–15.5)
WBC Count: 10.8 K/uL — ABNORMAL HIGH (ref 4.0–10.5)
nRBC: 0 % (ref 0.0–0.2)

## 2024-01-21 LAB — IRON AND IRON BINDING CAPACITY (CC-WL,HP ONLY)
Iron: 47 ug/dL (ref 28–170)
Saturation Ratios: 16 % (ref 10.4–31.8)
TIBC: 288 ug/dL (ref 250–450)
UIBC: 241 ug/dL

## 2024-01-21 LAB — RETICULOCYTES
Immature Retic Fract: 15.3 % (ref 2.3–15.9)
RBC.: 3.84 MIL/uL — ABNORMAL LOW (ref 3.87–5.11)
Retic Count, Absolute: 91 K/uL (ref 19.0–186.0)
Retic Ct Pct: 2.4 % (ref 0.4–3.1)

## 2024-01-21 LAB — FERRITIN: Ferritin: 210 ng/mL (ref 11–307)

## 2024-01-21 LAB — SAMPLE TO BLOOD BANK

## 2024-01-21 NOTE — Progress Notes (Signed)
 Hematology and Oncology Follow Up Visit  Sandy Moore [id number] August 17, 1955 68 y.o. 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 She is doing fairly well. She had an MRI on the left knee and states that this showed some fractures as well as torn meniscus. She refused surgery and is treating with antiinflammatories and red light therapy. She feels that this is helping. Swelling and pain have improved.  No numbness or tingling.  No new falls or syncope reported.  She notes fatigue at times.  No obvious blood loss noted. No abnormal bruising, no petechiae.  No fever, chills, n/v, cough, rash, dizziness, SOB, chest pain, palpitations, abdominal pain or changes in bowel or bladder habits at this time.  Appetite and hydration are good. Weight is stable at 234 lbs.   ECOG Performance Status: 1 - Symptomatic but completely ambulatory  Medications:  Allergies as of 01/21/2024       Reactions   Bee Venom Anaphylaxis   Diovan [valsartan] Other (See Comments)   Pt said possible the reason she went into kidney failure.   Privigen [immune Globulin (human)] Other (See Comments)   Cause muscle contortions all over my body per Pt.   Erythromycin Rash        Medication List        Accurate as of January 21, 2024 10:32 AM. If you have any questions, ask your nurse or doctor.          acetaminophen 500 MG tablet Commonly known as: TYLENOL Take by mouth.   atorvastatin 40 MG tablet Commonly known as: LIPITOR Take 40 mg by mouth daily.   cyanocobalamin 1000 MCG tablet Take 1,000 mcg by mouth every other day.   cycloSPORINE modified 25 MG capsule Commonly known as: NEORAL Take 150 mg by mouth 2 (two) times daily.   EPINEPHrine 0.3 mg/0.3 mL Soaj  injection Commonly known as: EPI-PEN Inject 0.3 mLs (0.3 mg total) into the muscle as needed for anaphylaxis.   estradiol 2 MG tablet Commonly known as: ESTRACE Take 1 tablet by mouth daily.   folic acid 1 MG tablet Commonly known as: FOLVITE Take 1 mg by mouth daily.   furosemide 40 MG tablet Commonly known as: LASIX Take 40 mg by mouth as needed.   Iron (Ferrous Sulfate) 325 (65 Fe) MG Tabs Take 325 mg by mouth daily.   levothyroxine 112 MCG tablet Commonly known as: SYNTHROID Take 112 mcg by mouth daily before breakfast.   losartan 50 MG tablet Commonly known as: COZAAR Take 50 mg by mouth 2 (two) times daily.   MAG-OXIDE PO Take 266 mg by mouth 2 (two) times daily.   multivitamin-lutein Caps capsule Take 1 capsule by mouth daily.   mycophenolate 360 MG Tbec EC tablet Commonly known as: MYFORTIC mycophenolate sodium 360 mg tablet,delayed release   omeprazole 20 MG capsule Commonly known as: PRILOSEC Take 20 mg by mouth 2 (two) times daily before a meal.   ondansetron 4 MG tablet Commonly known as: ZOFRAN Take 4 mg by mouth every 8 (eight) hours as needed for nausea or vomiting.   OVER THE COUNTER MEDICATION CYA Target   predniSONE 5 MG tablet Commonly known as: DELTASONE Take 5 mg by mouth daily with breakfast.   Saxenda  18 MG/3ML Sopn Generic drug: Liraglutide -Weight Management daily.   sodium bicarbonate 650 MG tablet Take 1,300 mg by mouth 3 (three) times daily.   sulfamethoxazole-trimethoprim 400-80 MG tablet Commonly known as: BACTRIM Take 1 tablet by mouth 3 (three) times a week.   traMADol 50 MG tablet Commonly known as: ULTRAM Take 1 tablet by mouth every 4 (four) hours as needed.        Allergies:  Allergies  Allergen Reactions   Bee Venom Anaphylaxis   Diovan [Valsartan] Other (See Comments)    Pt said possible the reason she went into kidney failure.   Privigen [Immune Globulin (Human)] Other (See Comments)    Cause muscle  contortions all over my body per Pt.   Erythromycin Rash    Past Medical History, Surgical history, Social history, and Family History were reviewed and updated.  Review of Systems: All other 10 point review of systems is negative.   Physical Exam:  vitals were not taken for this visit.   Wt Readings from Last 3 Encounters:  12/10/23 231 lb 1.9 oz (104.8 kg)  10/29/23 233 lb (105.7 kg)  10/16/23 236 lb 3.2 oz (107.1 kg)    Ocular: Sclerae unicteric, pupils equal, round and reactive to light Ear-nose-throat: Oropharynx clear, dentition fair Lymphatic: No cervical or supraclavicular adenopathy Lungs no rales or rhonchi, good excursion bilaterally Heart regular rate and rhythm, no murmur appreciated Abd soft, nontender, positive bowel sounds MSK no focal spinal tenderness, no joint edema Neuro: non-focal, well-oriented, appropriate affect Breasts: Deferred   Lab Results  Component Value Date   WBC 10.8 (H) 01/21/2024   HGB 12.1 01/21/2024   HCT 38.6 01/21/2024   MCV 99.2 01/21/2024   PLT 184 01/21/2024   Lab Results  Component Value Date   FERRITIN 230 12/10/2023   IRON  54 12/10/2023   TIBC 281 12/10/2023   UIBC 227 12/10/2023   IRONPCTSAT 19 12/10/2023   Lab Results  Component Value Date   RETICCTPCT 2.4 01/21/2024   RBC 3.84 (L) 01/21/2024   RETICCTABS 51.4 07/15/2009   No results found for: KPAFRELGTCHN, LAMBDASER, KAPLAMBRATIO No results found for: IGGSERUM, IGA, IGMSERUM No results found for: STEPHANY CARLOTA BENSON MARKEL EARLA JOANNIE DOC VICK, SPEI   Chemistry      Component Value Date/Time   NA 142 10/29/2023 1240   NA 142 01/12/2010 0815   K 4.4 10/29/2023 1240   K 4.7 01/12/2010 0815   CL 106 10/29/2023 1240   CL 104 01/12/2010 0815   CO2 25 10/29/2023 1240   CO2 26 01/12/2010 0815   BUN 30 (H) 10/29/2023 1240   BUN 29 (H) 01/12/2010 0815   CREATININE 1.35 (H) 10/29/2023 1240   CREATININE 3.0 (H)  01/12/2010 0815      Component Value Date/Time   CALCIUM 9.5 10/29/2023 1240   CALCIUM 10.2 01/12/2010 0815   ALKPHOS 92 10/29/2023 1240   ALKPHOS 109 (H) 01/12/2010 0815   AST 10 (L) 10/29/2023 1240   ALT 10 10/29/2023 1240   ALT 15 01/12/2010 0815   BILITOT 0.6 10/29/2023 1240       Impression and Plan: Sandy Moore is a very pleasant 68 yo caucasian female with anemia secondary to GI blood loss.  Iron  studies are pending. We will replace if needed.  Hgb is stable at 12.1! No transfusion needed at this time.  Follow-up in 2 months.   Lauraine Pepper, NP 8/11/202510:32 AM

## 2024-03-24 ENCOUNTER — Encounter: Payer: Self-pay | Admitting: Family

## 2024-03-24 ENCOUNTER — Inpatient Hospital Stay: Admitting: Family

## 2024-03-24 ENCOUNTER — Inpatient Hospital Stay: Attending: Hematology & Oncology

## 2024-03-24 VITALS — BP 140/67 | HR 77 | Temp 98.3°F | Resp 19 | Ht 64.0 in | Wt 239.8 lb

## 2024-03-24 DIAGNOSIS — K922 Gastrointestinal hemorrhage, unspecified: Secondary | ICD-10-CM | POA: Diagnosis not present

## 2024-03-24 DIAGNOSIS — Z79899 Other long term (current) drug therapy: Secondary | ICD-10-CM | POA: Diagnosis not present

## 2024-03-24 DIAGNOSIS — D500 Iron deficiency anemia secondary to blood loss (chronic): Secondary | ICD-10-CM | POA: Diagnosis not present

## 2024-03-24 DIAGNOSIS — R5383 Other fatigue: Secondary | ICD-10-CM | POA: Diagnosis not present

## 2024-03-24 DIAGNOSIS — Z881 Allergy status to other antibiotic agents status: Secondary | ICD-10-CM | POA: Insufficient documentation

## 2024-03-24 DIAGNOSIS — D539 Nutritional anemia, unspecified: Secondary | ICD-10-CM | POA: Insufficient documentation

## 2024-03-24 DIAGNOSIS — Z91030 Bee allergy status: Secondary | ICD-10-CM | POA: Insufficient documentation

## 2024-03-24 LAB — RETICULOCYTES
Immature Retic Fract: 25.4 % — ABNORMAL HIGH (ref 2.3–15.9)
RBC.: 3.59 MIL/uL — ABNORMAL LOW (ref 3.87–5.11)
Retic Count, Absolute: 97.6 K/uL (ref 19.0–186.0)
Retic Ct Pct: 2.7 % (ref 0.4–3.1)

## 2024-03-24 LAB — CBC WITH DIFFERENTIAL (CANCER CENTER ONLY)
Abs Immature Granulocytes: 0.11 K/uL — ABNORMAL HIGH (ref 0.00–0.07)
Basophils Absolute: 0.1 K/uL (ref 0.0–0.1)
Basophils Relative: 1 %
Eosinophils Absolute: 0.6 K/uL — ABNORMAL HIGH (ref 0.0–0.5)
Eosinophils Relative: 5 %
HCT: 34.4 % — ABNORMAL LOW (ref 36.0–46.0)
Hemoglobin: 10.8 g/dL — ABNORMAL LOW (ref 12.0–15.0)
Immature Granulocytes: 1 %
Lymphocytes Relative: 21 %
Lymphs Abs: 2.2 K/uL (ref 0.7–4.0)
MCH: 30.8 pg (ref 26.0–34.0)
MCHC: 31.4 g/dL (ref 30.0–36.0)
MCV: 98 fL (ref 80.0–100.0)
Monocytes Absolute: 0.9 K/uL (ref 0.1–1.0)
Monocytes Relative: 8 %
Neutro Abs: 6.6 K/uL (ref 1.7–7.7)
Neutrophils Relative %: 64 %
Platelet Count: 233 K/uL (ref 150–400)
RBC: 3.51 MIL/uL — ABNORMAL LOW (ref 3.87–5.11)
RDW: 14.6 % (ref 11.5–15.5)
WBC Count: 10.4 K/uL (ref 4.0–10.5)
nRBC: 0 % (ref 0.0–0.2)

## 2024-03-24 LAB — IRON AND IRON BINDING CAPACITY (CC-WL,HP ONLY)
Iron: 53 ug/dL (ref 28–170)
Saturation Ratios: 20 % (ref 10.4–31.8)
TIBC: 269 ug/dL (ref 250–450)
UIBC: 216 ug/dL

## 2024-03-24 LAB — SAMPLE TO BLOOD BANK

## 2024-03-24 LAB — SAVE SMEAR(SSMR), FOR PROVIDER SLIDE REVIEW

## 2024-03-24 LAB — FERRITIN: Ferritin: 245 ng/mL (ref 11–307)

## 2024-03-24 NOTE — Progress Notes (Signed)
 Hematology and Oncology Follow Up Visit  Sandy Moore [id number] Apr 07, 1956 68 y.o. 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 She is doing well but does note persistent fatigue.  She had a mechanical fall recently and landed on the right hip and shoulder. Bruising has healed along with nicely healing abrasion on the right elbow. No swelling or signs of infection.  No syncope reported.  She ordered a quad Hurrycane and will be using that for added support.  No obvious blood loss. No abnormal bruising or petechiae.  No fever, chills, n/v, cough, rash, dizziness, SOB, chest pain, palpitations, abdominal pain or changes in bowel or bladder habits.  Appetite and hydration are good. Weight is stable at 239 lbs.   ECOG Performance Status: 1 - Symptomatic but completely ambulatory  Medications:  Allergies as of 03/24/2024       Reactions   Bee Venom Anaphylaxis   Diovan [valsartan] Other (See Comments)   Pt said possible the reason she went into kidney failure.   Privigen [immune Globulin (human)] Other (See Comments)   Cause muscle contortions all over my body per Pt.   Erythromycin Rash        Medication List        Accurate as of March 24, 2024 10:18 AM. If you have any questions, ask your nurse or doctor.          acetaminophen  500 MG tablet Commonly known as: TYLENOL  Take by mouth.   atorvastatin 40 MG tablet Commonly known as: LIPITOR Take 40 mg by mouth daily.   cyanocobalamin  1000 MCG tablet Take 1,000 mcg by mouth every other day.   cycloSPORINE  modified 25 MG capsule Commonly known as: NEORAL  Take 150 mg by mouth 2 (two) times daily.   EPINEPHrine  0.3 mg/0.3 mL Soaj injection Commonly known as: EPI-PEN Inject 0.3 mLs (0.3  mg total) into the muscle as needed for anaphylaxis.   estradiol 2 MG tablet Commonly known as: ESTRACE Take 1 tablet by mouth daily.   folic acid 1 MG tablet Commonly known as: FOLVITE Take 1 mg by mouth daily.   furosemide  40 MG tablet Commonly known as: LASIX  Take 40 mg by mouth as needed.   Iron  (Ferrous Sulfate ) 325 (65 Fe) MG Tabs Take 325 mg by mouth daily.   levothyroxine 112 MCG tablet Commonly known as: SYNTHROID Take 112 mcg by mouth daily before breakfast.   losartan 50 MG tablet Commonly known as: COZAAR Take 50 mg by mouth 2 (two) times daily.   MAG-OXIDE PO Take 266 mg by mouth 2 (two) times daily.   multivitamin-lutein Caps capsule Take 1 capsule by mouth daily.   mycophenolate 360 MG Tbec EC tablet Commonly known as: MYFORTIC mycophenolate sodium 360 mg tablet,delayed release   omeprazole 20 MG capsule Commonly known as: PRILOSEC Take 20 mg by mouth 2 (two) times daily before a meal.   ondansetron 4 MG tablet Commonly known as: ZOFRAN Take 4 mg by mouth every 8 (eight) hours as needed for nausea or vomiting.   OVER THE COUNTER MEDICATION CYA Target   predniSONE  5 MG tablet Commonly known as: DELTASONE  Take 5 mg by mouth daily with breakfast.   Saxenda 18 MG/3ML Sopn Generic drug: Liraglutide -Weight Management daily.  sodium bicarbonate 650 MG tablet Take 1,300 mg by mouth 3 (three) times daily.   sulfamethoxazole-trimethoprim 400-80 MG tablet Commonly known as: BACTRIM Take 1 tablet by mouth 3 (three) times a week.   traMADol 50 MG tablet Commonly known as: ULTRAM Take 1 tablet by mouth every 4 (four) hours as needed.        Allergies:  Allergies  Allergen Reactions   Bee Venom Anaphylaxis   Diovan [Valsartan] Other (See Comments)    Pt said possible the reason she went into kidney failure.   Privigen [Immune Globulin (Human)] Other (See Comments)    Cause muscle contortions all over my body per Pt.   Erythromycin Rash     Past Medical History, Surgical history, Social history, and Family History were reviewed and updated.  Review of Systems: All other 10 point review of systems is negative.   Physical Exam:  vitals were not taken for this visit.   Wt Readings from Last 3 Encounters:  01/21/24 234 lb 1.3 oz (106.2 kg)  12/10/23 231 lb 1.9 oz (104.8 kg)  10/29/23 233 lb (105.7 kg)    Ocular: Sclerae unicteric, pupils equal, round and reactive to light Ear-nose-throat: Oropharynx clear, dentition fair Lymphatic: No cervical or supraclavicular adenopathy Lungs no rales or rhonchi, good excursion bilaterally Heart regular rate and rhythm, no murmur appreciated Abd soft, nontender, positive bowel sounds MSK no focal spinal tenderness, no joint edema Neuro: non-focal, well-oriented, appropriate affect Breasts: Deferred   Lab Results  Component Value Date   WBC 10.8 (H) 01/21/2024   HGB 12.1 01/21/2024   HCT 38.6 01/21/2024   MCV 99.2 01/21/2024   PLT 184 01/21/2024   Lab Results  Component Value Date   FERRITIN 210 01/21/2024   IRON  47 01/21/2024   TIBC 288 01/21/2024   UIBC 241 01/21/2024   IRONPCTSAT 16 01/21/2024   Lab Results  Component Value Date   RETICCTPCT 2.4 01/21/2024   RBC 3.84 (L) 01/21/2024   RETICCTABS 51.4 07/15/2009   No results found for: KPAFRELGTCHN, LAMBDASER, KAPLAMBRATIO No results found for: IGGSERUM, IGA, IGMSERUM No results found for: STEPHANY CARLOTA BENSON MARKEL EARLA JOANNIE DOC VICK, SPEI   Chemistry      Component Value Date/Time   NA 142 10/29/2023 1240   NA 142 01/12/2010 0815   K 4.4 10/29/2023 1240   K 4.7 01/12/2010 0815   CL 106 10/29/2023 1240   CL 104 01/12/2010 0815   CO2 25 10/29/2023 1240   CO2 26 01/12/2010 0815   BUN 30 (H) 10/29/2023 1240   BUN 29 (H) 01/12/2010 0815   CREATININE 1.35 (H) 10/29/2023 1240   CREATININE 3.0 (H) 01/12/2010 0815      Component Value Date/Time   CALCIUM  9.5 10/29/2023 1240   CALCIUM 10.2 01/12/2010 0815   ALKPHOS 92 10/29/2023 1240   ALKPHOS 109 (H) 01/12/2010 0815   AST 10 (L) 10/29/2023 1240   ALT 10 10/29/2023 1240   ALT 15 01/12/2010 0815   BILITOT 0.6 10/29/2023 1240       Impression and Plan: Sandy Moore is a very pleasant 68 yo caucasian female with anemia secondary to GI blood loss.  Iron  studies are pending. We will replace if needed.  No transfusion needed at this time, Hgb 10.8.   Follow-up in 3 months.   Lauraine Pepper, NP 10/13/202510:18 AM

## 2024-05-26 LAB — OPHTHALMOLOGY REPORT-SCANNED

## 2024-06-24 ENCOUNTER — Inpatient Hospital Stay: Admitting: Family

## 2024-06-24 ENCOUNTER — Inpatient Hospital Stay: Attending: Hematology & Oncology

## 2024-06-24 ENCOUNTER — Encounter: Payer: Self-pay | Admitting: Family

## 2024-06-24 ENCOUNTER — Other Ambulatory Visit: Payer: Self-pay

## 2024-06-24 VITALS — BP 149/83 | HR 103 | Temp 98.0°F | Resp 18 | Ht 64.0 in | Wt 239.0 lb

## 2024-06-24 DIAGNOSIS — Z79899 Other long term (current) drug therapy: Secondary | ICD-10-CM | POA: Insufficient documentation

## 2024-06-24 DIAGNOSIS — D500 Iron deficiency anemia secondary to blood loss (chronic): Secondary | ICD-10-CM

## 2024-06-24 DIAGNOSIS — Z881 Allergy status to other antibiotic agents status: Secondary | ICD-10-CM | POA: Diagnosis not present

## 2024-06-24 DIAGNOSIS — Z91030 Bee allergy status: Secondary | ICD-10-CM | POA: Diagnosis not present

## 2024-06-24 DIAGNOSIS — K922 Gastrointestinal hemorrhage, unspecified: Secondary | ICD-10-CM | POA: Insufficient documentation

## 2024-06-24 LAB — RETICULOCYTES
Immature Retic Fract: 14.6 % (ref 2.3–15.9)
RBC.: 4 MIL/uL (ref 3.87–5.11)
Retic Count, Absolute: 77.2 K/uL (ref 19.0–186.0)
Retic Ct Pct: 1.9 % (ref 0.4–3.1)

## 2024-06-24 LAB — CBC WITH DIFFERENTIAL (CANCER CENTER ONLY)
Abs Immature Granulocytes: 0.12 K/uL — ABNORMAL HIGH (ref 0.00–0.07)
Basophils Absolute: 0.1 K/uL (ref 0.0–0.1)
Basophils Relative: 0 %
Eosinophils Absolute: 0.6 K/uL — ABNORMAL HIGH (ref 0.0–0.5)
Eosinophils Relative: 5 %
HCT: 38.3 % (ref 36.0–46.0)
Hemoglobin: 11.9 g/dL — ABNORMAL LOW (ref 12.0–15.0)
Immature Granulocytes: 1 %
Lymphocytes Relative: 24 %
Lymphs Abs: 2.8 K/uL (ref 0.7–4.0)
MCH: 29.5 pg (ref 26.0–34.0)
MCHC: 31.1 g/dL (ref 30.0–36.0)
MCV: 94.8 fL (ref 80.0–100.0)
Monocytes Absolute: 1 K/uL (ref 0.1–1.0)
Monocytes Relative: 8 %
Neutro Abs: 7.4 K/uL (ref 1.7–7.7)
Neutrophils Relative %: 62 %
Platelet Count: 262 K/uL (ref 150–400)
RBC: 4.04 MIL/uL (ref 3.87–5.11)
RDW: 14.6 % (ref 11.5–15.5)
WBC Count: 12 K/uL — ABNORMAL HIGH (ref 4.0–10.5)
nRBC: 0 % (ref 0.0–0.2)

## 2024-06-24 LAB — IRON AND IRON BINDING CAPACITY (CC-WL,HP ONLY)
Iron: 60 ug/dL (ref 28–170)
Saturation Ratios: 22 % (ref 10.4–31.8)
TIBC: 276 ug/dL (ref 250–450)
UIBC: 216 ug/dL

## 2024-06-24 LAB — SAMPLE TO BLOOD BANK

## 2024-06-24 LAB — FERRITIN: Ferritin: 185 ng/mL (ref 11–307)

## 2024-06-24 NOTE — Progress Notes (Signed)
 " Hematology and Oncology Follow Up Visit  Sandy Moore [id number] 13-May-1956 69 y.o. 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 She is doing well and has no complaints at this time. She is excited for her upcoming cruise.  No obvious blood loss noted.  No bruising, no petechiae.  No fever, chills, n/v, cough, rash, dizziness, SOB, chest pain, palpitations, abdominal pain or changes in bowel or bladder habits.  No swelling, numbness or tingling in her extremities.  No falls or syncope.  Appetite and hydration are good. Weight is stable at 239 lbs.   ECOG Performance Status: 0 - Asymptomatic  Medications:  Allergies as of 06/24/2024       Reactions   Bee Venom Anaphylaxis   Diovan [valsartan] Other (See Comments)   Pt said possible the reason she went into kidney failure.   Privigen [immune Globulin (human)] Other (See Comments)   Cause muscle contortions all over my body per Pt.   Erythromycin Rash        Medication List        Accurate as of June 24, 2024 11:03 AM. If you have any questions, ask your nurse or doctor.          acetaminophen  500 MG tablet Commonly known as: TYLENOL  Take by mouth.   atorvastatin 40 MG tablet Commonly known as: LIPITOR Take 40 mg by mouth daily.   cyanocobalamin  1000 MCG tablet Take 1,000 mcg by mouth every other day.   cycloSPORINE  modified 25 MG capsule Commonly known as: NEORAL  Take 150 mg by mouth 2 (two) times daily.   EPINEPHrine  0.3 mg/0.3 mL Soaj injection Commonly known as: EPI-PEN Inject 0.3 mLs (0.3 mg total) into the muscle as needed for anaphylaxis.   estradiol 2 MG tablet Commonly known as: ESTRACE Take 1 tablet by mouth daily.   folic acid 1 MG tablet Commonly known as:  FOLVITE Take 1 mg by mouth daily.   furosemide  40 MG tablet Commonly known as: LASIX  Take 40 mg by mouth as needed.   Iron  (Ferrous Sulfate ) 325 (65 Fe) MG Tabs Take 325 mg by mouth daily.   levothyroxine 112 MCG tablet Commonly known as: SYNTHROID Take 112 mcg by mouth daily before breakfast.   losartan 50 MG tablet Commonly known as: COZAAR Take 50 mg by mouth 2 (two) times daily.   MAG-OXIDE PO Take 266 mg by mouth 2 (two) times daily.   multivitamin-lutein Caps capsule Take 1 capsule by mouth daily.   mycophenolate 360 MG Tbec EC tablet Commonly known as: MYFORTIC mycophenolate sodium 360 mg tablet,delayed release   omeprazole 20 MG capsule Commonly known as: PRILOSEC Take 20 mg by mouth 2 (two) times daily before a meal.   ondansetron 4 MG tablet Commonly known as: ZOFRAN Take 4 mg by mouth every 8 (eight) hours as needed for nausea or vomiting.   OVER THE COUNTER MEDICATION CYA Target   predniSONE  5 MG tablet Commonly known as: DELTASONE  Take 5 mg by mouth daily with breakfast.   Saxenda 18 MG/3ML Sopn Generic drug: Liraglutide -Weight Management daily.   sodium bicarbonate 650 MG tablet Take 1,300 mg by mouth 3 (three) times daily.   sulfamethoxazole-trimethoprim 400-80 MG tablet Commonly known as: BACTRIM Take 1 tablet by  mouth 3 (three) times a week.   traMADol 50 MG tablet Commonly known as: ULTRAM Take 1 tablet by mouth every 4 (four) hours as needed.        Allergies: Allergies[1]  Past Medical History, Surgical history, Social history, and Family History were reviewed and updated.  Review of Systems: All other 10 point review of systems is negative.   Physical Exam:  height is 5' 4 (1.626 m) and weight is 239 lb (108.4 kg). Her oral temperature is 98 F (36.7 C). Her blood pressure is 149/83 (abnormal) and her pulse is 103 (abnormal). Her respiration is 18 and oxygen saturation is 100%.   Wt Readings from Last 3 Encounters:   06/24/24 239 lb (108.4 kg)  03/24/24 239 lb 12.8 oz (108.8 kg)  01/21/24 234 lb 1.3 oz (106.2 kg)    Ocular: Sclerae unicteric, pupils equal, round and reactive to light Ear-nose-throat: Oropharynx clear, dentition fair Lymphatic: No cervical or supraclavicular adenopathy Lungs no rales or rhonchi, good excursion bilaterally Heart regular rate and rhythm, no murmur appreciated Abd soft, nontender, positive bowel sounds MSK no focal spinal tenderness, no joint edema Neuro: non-focal, well-oriented, appropriate affect Breasts: Deferred   Lab Results  Component Value Date   WBC 12.0 (H) 06/24/2024   HGB 11.9 (L) 06/24/2024   HCT 38.3 06/24/2024   MCV 94.8 06/24/2024   PLT 262 06/24/2024   Lab Results  Component Value Date   FERRITIN 245 03/24/2024   IRON  53 03/24/2024   TIBC 269 03/24/2024   UIBC 216 03/24/2024   IRONPCTSAT 20 03/24/2024   Lab Results  Component Value Date   RETICCTPCT 1.9 06/24/2024   RBC 4.00 06/24/2024   RBC 4.04 06/24/2024   RETICCTABS 51.4 07/15/2009   No results found for: KPAFRELGTCHN, LAMBDASER, KAPLAMBRATIO No results found for: IGGSERUM, IGA, IGMSERUM No results found for: STEPHANY CARLOTA BENSON MARKEL EARLA JOANNIE DOC VICK, SPEI   Chemistry      Component Value Date/Time   NA 142 10/29/2023 1240   NA 142 01/12/2010 0815   K 4.4 10/29/2023 1240   K 4.7 01/12/2010 0815   CL 106 10/29/2023 1240   CL 104 01/12/2010 0815   CO2 25 10/29/2023 1240   CO2 26 01/12/2010 0815   BUN 30 (H) 10/29/2023 1240   BUN 29 (H) 01/12/2010 0815   CREATININE 1.35 (H) 10/29/2023 1240   CREATININE 3.0 (H) 01/12/2010 0815      Component Value Date/Time   CALCIUM 9.5 10/29/2023 1240   CALCIUM 10.2 01/12/2010 0815   ALKPHOS 92 10/29/2023 1240   ALKPHOS 109 (H) 01/12/2010 0815   AST 10 (L) 10/29/2023 1240   ALT 10 10/29/2023 1240   ALT 15 01/12/2010 0815   BILITOT 0.6 10/29/2023 1240       Impression and  Plan: Sandy Moore is a very pleasant 69 yo caucasian female with anemia secondary to GI blood loss.  Iron  studies are pending. We will replace if needed.  No transfusion needed at this time, Hgb 11.9.   Follow-up in 3 months.   Lauraine Pepper, NP 1/13/202611:03 AM     [1]  Allergies Allergen Reactions   Bee Venom Anaphylaxis   Diovan [Valsartan] Other (See Comments)    Pt said possible the reason she went into kidney failure.   Privigen [Immune Globulin (Human)] Other (See Comments)    Cause muscle contortions all over my body per Pt.   Erythromycin Rash   "

## 2024-09-29 ENCOUNTER — Inpatient Hospital Stay: Admitting: Family

## 2024-09-29 ENCOUNTER — Inpatient Hospital Stay
